# Patient Record
Sex: Female | Born: 1980
Health system: Southern US, Community
[De-identification: ages and names within clinical notes are randomized; demographics above are authoritative.]

## PROBLEM LIST (undated history)

## (undated) ENCOUNTER — Inpatient Hospital Stay (HOSPITAL_COMMUNITY): Payer: Self-pay

## (undated) ENCOUNTER — Inpatient Hospital Stay (HOSPITAL_COMMUNITY): Payer: 59

## (undated) DIAGNOSIS — R87619 Unspecified abnormal cytological findings in specimens from cervix uteri: Secondary | ICD-10-CM

## (undated) DIAGNOSIS — D649 Anemia, unspecified: Secondary | ICD-10-CM

## (undated) DIAGNOSIS — O99345 Other mental disorders complicating the puerperium: Secondary | ICD-10-CM

## (undated) DIAGNOSIS — F53 Postpartum depression: Secondary | ICD-10-CM

## (undated) DIAGNOSIS — IMO0002 Reserved for concepts with insufficient information to code with codable children: Secondary | ICD-10-CM

## (undated) HISTORY — PX: WISDOM TOOTH EXTRACTION: SHX21

---

## 2007-01-21 HISTORY — PX: APPENDECTOMY: SHX54

## 2008-03-16 ENCOUNTER — Other Ambulatory Visit: Admission: RE | Admit: 2008-03-16 | Discharge: 2008-03-16 | Payer: Self-pay | Admitting: Obstetrics and Gynecology

## 2008-09-13 ENCOUNTER — Inpatient Hospital Stay (HOSPITAL_COMMUNITY): Admission: AD | Admit: 2008-09-13 | Discharge: 2008-09-15 | Payer: Self-pay | Admitting: Obstetrics and Gynecology

## 2009-05-07 ENCOUNTER — Other Ambulatory Visit: Admission: RE | Admit: 2009-05-07 | Discharge: 2009-05-07 | Payer: Self-pay | Admitting: Obstetrics and Gynecology

## 2010-04-27 LAB — CBC
Hemoglobin: 11.6 g/dL — ABNORMAL LOW (ref 12.0–15.0)
MCHC: 32.6 g/dL (ref 30.0–36.0)
MCHC: 33.4 g/dL (ref 30.0–36.0)
Platelets: 202 10*3/uL (ref 150–400)
RBC: 3.14 MIL/uL — ABNORMAL LOW (ref 3.87–5.11)
RDW: 13.1 % (ref 11.5–15.5)

## 2010-04-27 LAB — RPR: RPR Ser Ql: NONREACTIVE

## 2010-05-17 ENCOUNTER — Inpatient Hospital Stay (HOSPITAL_COMMUNITY)
Admission: AD | Admit: 2010-05-17 | Discharge: 2010-05-20 | DRG: 775 | Disposition: A | Discharge status: Home / Self Care | Payer: Managed Care, Other (non HMO) | Source: Ambulatory Visit | Attending: Obstetrics and Gynecology | Admitting: Obstetrics and Gynecology

## 2010-05-17 DIAGNOSIS — O99892 Other specified diseases and conditions complicating childbirth: Secondary | ICD-10-CM | POA: Diagnosis present

## 2010-05-17 DIAGNOSIS — Z22330 Carrier of Group B streptococcus: Secondary | ICD-10-CM

## 2010-05-18 LAB — CBC
Hemoglobin: 9.5 g/dL — ABNORMAL LOW (ref 12.0–15.0)
MCV: 86.2 fL (ref 78.0–100.0)
Platelets: 199 10*3/uL (ref 150–400)
RBC: 3.49 MIL/uL — ABNORMAL LOW (ref 3.87–5.11)
WBC: 10.5 10*3/uL (ref 4.0–10.5)

## 2010-05-18 LAB — RPR: RPR Ser Ql: NONREACTIVE

## 2010-05-19 LAB — CBC
Hemoglobin: 9.9 g/dL — ABNORMAL LOW (ref 12.0–15.0)
MCV: 85.6 fL (ref 78.0–100.0)
Platelets: 192 10*3/uL (ref 150–400)
RBC: 3.62 MIL/uL — ABNORMAL LOW (ref 3.87–5.11)
WBC: 12.7 10*3/uL — ABNORMAL HIGH (ref 4.0–10.5)

## 2010-05-29 ENCOUNTER — Inpatient Hospital Stay (HOSPITAL_COMMUNITY)
Admission: AD | Admit: 2010-05-29 | Discharge: 2010-05-30 | Disposition: A | Discharge status: Home / Self Care | Payer: Managed Care, Other (non HMO) | Source: Ambulatory Visit | Attending: Obstetrics and Gynecology | Admitting: Obstetrics and Gynecology

## 2010-05-29 DIAGNOSIS — O9122 Nonpurulent mastitis associated with the puerperium: Secondary | ICD-10-CM

## 2010-05-29 DIAGNOSIS — O864 Pyrexia of unknown origin following delivery: Secondary | ICD-10-CM

## 2010-05-29 LAB — CBC
HCT: 32.4 % — ABNORMAL LOW (ref 36.0–46.0)
MCV: 83.9 fL (ref 78.0–100.0)
RBC: 3.86 MIL/uL — ABNORMAL LOW (ref 3.87–5.11)
WBC: 12.3 10*3/uL — ABNORMAL HIGH (ref 4.0–10.5)

## 2010-05-29 LAB — DIFFERENTIAL
Lymphocytes Relative: 9 % — ABNORMAL LOW (ref 12–46)
Lymphs Abs: 1.1 10*3/uL (ref 0.7–4.0)
Neutrophils Relative %: 85 % — ABNORMAL HIGH (ref 43–77)

## 2010-08-21 ENCOUNTER — Other Ambulatory Visit (HOSPITAL_COMMUNITY)
Admission: RE | Admit: 2010-08-21 | Discharge: 2010-08-21 | Disposition: A | Discharge status: Home / Self Care | Payer: Managed Care, Other (non HMO) | Source: Ambulatory Visit | Attending: Obstetrics and Gynecology | Admitting: Obstetrics and Gynecology

## 2010-08-21 DIAGNOSIS — Z01419 Encounter for gynecological examination (general) (routine) without abnormal findings: Secondary | ICD-10-CM | POA: Insufficient documentation

## 2010-11-11 LAB — OB RESULTS CONSOLE ANTIBODY SCREEN: Antibody Screen: NEGATIVE

## 2010-11-11 LAB — OB RESULTS CONSOLE HIV ANTIBODY (ROUTINE TESTING): HIV: NONREACTIVE

## 2010-11-11 LAB — OB RESULTS CONSOLE RUBELLA ANTIBODY, IGM: Rubella: IMMUNE

## 2011-01-03 ENCOUNTER — Other Ambulatory Visit: Payer: Self-pay | Admitting: Obstetrics and Gynecology

## 2011-01-09 ENCOUNTER — Other Ambulatory Visit (HOSPITAL_COMMUNITY): Payer: Self-pay | Admitting: Obstetrics and Gynecology

## 2011-01-09 DIAGNOSIS — O280 Abnormal hematological finding on antenatal screening of mother: Secondary | ICD-10-CM

## 2011-01-21 NOTE — L&D Delivery Note (Signed)
Delivery Note At 12:52 AM a viable female was delivered via Vaginal, Spontaneous Delivery (Presentation: Middle Occiput Anterior).  APGAR: 9, 9; weight .   Placenta status: Intact, Spontaneous.  Cord: 3 vessels with the following complications: None.  Cord pH: NA  Anesthesia: Local  Episiotomy: None Lacerations: 1st degree;Perineal Suture Repair: 3.0 vicryl Est. Blood Loss (mL): 150  Mom to postpartum.  Baby to nursery-stable.  Molly Rizo J. 06/11/2011, 1:11 AM

## 2011-01-22 ENCOUNTER — Ambulatory Visit (HOSPITAL_COMMUNITY): Payer: Managed Care, Other (non HMO)

## 2011-01-24 ENCOUNTER — Ambulatory Visit (HOSPITAL_COMMUNITY)
Admission: RE | Admit: 2011-01-24 | Discharge: 2011-01-24 | Disposition: A | Discharge status: Home / Self Care | Payer: Managed Care, Other (non HMO) | Source: Ambulatory Visit | Attending: Obstetrics and Gynecology | Admitting: Obstetrics and Gynecology

## 2011-01-24 ENCOUNTER — Encounter (HOSPITAL_COMMUNITY): Payer: Self-pay

## 2011-01-24 DIAGNOSIS — O280 Abnormal hematological finding on antenatal screening of mother: Secondary | ICD-10-CM

## 2011-01-24 DIAGNOSIS — O358XX0 Maternal care for other (suspected) fetal abnormality and damage, not applicable or unspecified: Secondary | ICD-10-CM | POA: Insufficient documentation

## 2011-01-24 DIAGNOSIS — Z1389 Encounter for screening for other disorder: Secondary | ICD-10-CM | POA: Insufficient documentation

## 2011-01-24 DIAGNOSIS — Z363 Encounter for antenatal screening for malformations: Secondary | ICD-10-CM | POA: Insufficient documentation

## 2011-01-24 NOTE — Progress Notes (Signed)
Genetic Counseling  High-Risk Gestation Note  Appointment Date:  01/24/2011 Referred By: Molly Silva., MD Date of Birth:  Jun 07, 1980 Partner:  Molly Silva  Pregnancy History: [id number] Estimated Date of Delivery: 06/17/11 Estimated Gestational Age: [redacted]w[redacted]d Attending: Particia Nearing, MD  [name] and her husband, Mr. Molly Silva, were seen for genetic counseling because of an increased risk for fetal Down syndrome based on Quad screening performed through [facility].  They were counseled regarding the Quad screen result and the associated 1 in 112 risk for fetal Down syndrome.  We reviewed chromosomes, nondisjunction, and the common features and variable prognosis of Down syndrome.  In addition, we reviewed the screen negative risks for trisomy 18 and ONTDs.  We also discussed other explanations for a screen positive result including: a gestational dating error, differences in maternal metabolism, and normal variation.  We reviewed other available screening and diagnostic options including detailed ultrasound and amniocentesis.  We discussed the risks, limitations, and benefits of each. We discussed another type of screening test, noninvasive prenatal testing (NIPT), which utilizes cell free fetal DNA found in the maternal circulation. This test is not diagnostic for chromosome conditions, but can provide information regarding the presence or absence of extra fetal DNA for chromosomes 13, 18 and 21. The reported detection rate is greater than 99% for Trisomy 21, greater than 97% for Trisomy 18, and is approximately 80% (8 out of 10) for Trisomy 13. The false positive rate is thought to be less than 1% for any of these conditions. After thoughtful consideration of these options, [name] elected to have ultrasound, but declined amniocentesis and cell free fetal DNA testing at this time.  They understand that ultrasound cannot rule out all birth defects or genetic syndromes.  The patient  was advised of this limitation and states she still does not want diagnostic testing at this time.  However, they were counseled that 50-80% of fetuses with Down syndrome, when well visualized, have detectable anomalies or soft markers by ultrasound.  A complete ultrasound was performed today.  The ultrasound report will be sent under separate cover.  Molly Silva was provided with written information regarding sickle cell anemia (SCA) including the carrier frequency and incidence in the African-American population, the availability of carrier testing and prenatal diagnosis if indicated.  In addition, we discussed that hemoglobinopathies are routinely screened for as part of the [place] newborn screening panel.  She declined hemoglobin electrophoresis and additional discussion of SCA screening today.   Both family histories were reviewed and found to be noncontributory for birth defects, mental retardation, recurrent pregnancy loss, or known genetic conditions. Without further information regarding the provided family history, an accurate genetic risk cannot be calculated. Further genetic counseling is warranted if more information is obtained.  Molly Silva denied exposure to environmental toxins or chemical agents. She denied the use of alcohol, tobacco or street drugs. She denied significant viral illnesses during the course of her pregnancy. Her medical and surgical histories were noncontributory.   I counseled this couple for approximately 30 minutes regarding the above risks and available options.     Molly Plowman, MS,  Certified Genetic Counselor 01/24/2011

## 2011-01-28 ENCOUNTER — Other Ambulatory Visit: Payer: Self-pay

## 2011-05-20 LAB — OB RESULTS CONSOLE GBS: GBS: POSITIVE

## 2011-06-10 ENCOUNTER — Encounter (HOSPITAL_COMMUNITY): Payer: Self-pay

## 2011-06-10 ENCOUNTER — Inpatient Hospital Stay (HOSPITAL_COMMUNITY)
Admission: AD | Admit: 2011-06-10 | Discharge: 2011-06-13 | DRG: 775 | Disposition: A | Discharge status: Home / Self Care | Payer: Managed Care, Other (non HMO) | Source: Ambulatory Visit | Attending: Obstetrics and Gynecology | Admitting: Obstetrics and Gynecology

## 2011-06-10 DIAGNOSIS — O99892 Other specified diseases and conditions complicating childbirth: Secondary | ICD-10-CM | POA: Diagnosis present

## 2011-06-10 DIAGNOSIS — Z22330 Carrier of Group B streptococcus: Secondary | ICD-10-CM

## 2011-06-10 HISTORY — DX: Unspecified abnormal cytological findings in specimens from cervix uteri: R87.619

## 2011-06-10 HISTORY — DX: Reserved for concepts with insufficient information to code with codable children: IMO0002

## 2011-06-10 LAB — CBC
HCT: 35.2 % — ABNORMAL LOW (ref 36.0–46.0)
MCHC: 32.4 g/dL (ref 30.0–36.0)
MCV: 85 fL (ref 78.0–100.0)
RDW: 13.2 % (ref 11.5–15.5)

## 2011-06-10 MED ORDER — IBUPROFEN 600 MG PO TABS: 600.0000 mg | ORAL_TABLET | Freq: Four times a day (QID) | ORAL | Status: DC | PRN

## 2011-06-10 MED ORDER — LIDOCAINE HCL (PF) 1 % IJ SOLN
30.0000 mL | INTRAMUSCULAR | Status: DC | PRN
Start: 2011-06-10 — End: 2011-06-11
  Administered 2011-06-11: 30 mL via SUBCUTANEOUS
  Filled 2011-06-10: qty 30

## 2011-06-10 MED ORDER — LACTATED RINGERS IV SOLN
INTRAVENOUS | Status: DC
  Administered 2011-06-10: 22:00:00 via INTRAVENOUS

## 2011-06-10 MED ORDER — BUTORPHANOL TARTRATE 2 MG/ML IJ SOLN: 1.0000 mg | INTRAMUSCULAR | Status: DC | PRN

## 2011-06-10 MED ORDER — OXYTOCIN 20 UNITS IN LACTATED RINGERS INFUSION - SIMPLE
125.0000 mL/h | Freq: Once | INTRAVENOUS | Status: AC
  Administered 2011-06-11: 125 mL/h via INTRAVENOUS

## 2011-06-10 MED ORDER — PENICILLIN G POTASSIUM 5000000 UNITS IJ SOLR
5.0000 10*6.[IU] | Freq: Once | INTRAVENOUS | Status: AC
  Administered 2011-06-10: 5 10*6.[IU] via INTRAVENOUS
  Filled 2011-06-10: qty 5

## 2011-06-10 MED ORDER — OXYTOCIN BOLUS FROM INFUSION
500.0000 mL | Freq: Once | INTRAVENOUS | Status: AC
  Administered 2011-06-11: 500 mL via INTRAVENOUS
  Filled 2011-06-10: qty 500
  Filled 2011-06-10: qty 1000

## 2011-06-10 MED ORDER — ACETAMINOPHEN 325 MG PO TABS: 650.0000 mg | ORAL_TABLET | ORAL | Status: DC | PRN

## 2011-06-10 MED ORDER — LACTATED RINGERS IV SOLN: 500.0000 mL | INTRAVENOUS | Status: DC | PRN

## 2011-06-10 MED ORDER — FLEET ENEMA 7-19 GM/118ML RE ENEM: 1.0000 | ENEMA | RECTAL | Status: DC | PRN

## 2011-06-10 MED ORDER — DEXTROSE 5 % IV SOLN
2.5000 10*6.[IU] | INTRAVENOUS | Status: DC
  Filled 2011-06-10 (×4): qty 2.5

## 2011-06-10 MED ORDER — OXYCODONE-ACETAMINOPHEN 5-325 MG PO TABS: 1.0000 | ORAL_TABLET | ORAL | Status: DC | PRN

## 2011-06-10 MED ORDER — ONDANSETRON HCL 4 MG/2ML IJ SOLN: 4.0000 mg | Freq: Four times a day (QID) | INTRAMUSCULAR | Status: DC | PRN

## 2011-06-10 MED ORDER — CITRIC ACID-SODIUM CITRATE 334-500 MG/5ML PO SOLN: 30.0000 mL | ORAL | Status: DC | PRN

## 2011-06-10 NOTE — H&P (Addendum)
Molly Silva is a 31 y.o. female presenting c/o of contractions q3-5 minutes since 6 pm this evening.  questionable LOF at 1930 clear fluid . +FM no VB  OB History    Grav Para Term Preterm Abortions TAB SAB Ect Mult Living   4 2 2  0 1 1 0 0 0 2     Past Medical History  Diagnosis Date  . Abnormal Pap smear    Past Surgical History  Procedure Date  . Appendectomy 2009   Family History: family history is not on file. Social History:  reports that she has never smoked. She has never used smokeless tobacco. She reports that she does not drink alcohol or use illicit drugs.  Medication PNV Allergies NKDA   Dilation: 3 Effacement (%): 80 Blood pressure 112/72, pulse 80, temperature 98.6 F (37 C), temperature source Oral, resp. rate 20, height 5' 6.75" (1.695 m), weight 66.679 kg (147 lb), last menstrual period 09/10/2010. CV RRR LUNG clear Abd gravid nontender  Ext no edema  cx 3/75/0 station no pooling with valsalva  FHR baseline 130's good btbv +accels variable decels Category 1 tracing  Toco ctx q 3-5 minutes  Prenatal labs: ABO, Rh:   o positive  Antibody: Negative (10/22 0000) Rubella:  Immune  RPR: Nonreactive (03/01 0000)  HBsAg: Negative (10/22 0000)  HIV: Non-reactive (10/22 0000)  GBS: Positive (04/30 0000)   Assessment/Plan: 39 wks labor h/o precipitous delivery  GBS positive. PCN for prophylaxis  Anticipate svd    Ziggy Reveles J. 06/10/2011, 10:43 PM

## 2011-06-11 ENCOUNTER — Encounter (HOSPITAL_COMMUNITY): Payer: Self-pay

## 2011-06-11 MED ORDER — ONDANSETRON HCL 4 MG PO TABS: 4.0000 mg | ORAL_TABLET | ORAL | Status: DC | PRN

## 2011-06-11 MED ORDER — IBUPROFEN 600 MG PO TABS
600.0000 mg | ORAL_TABLET | Freq: Four times a day (QID) | ORAL | Status: DC
  Filled 2011-06-11 (×4): qty 1

## 2011-06-11 MED ORDER — LANOLIN HYDROUS EX OINT: TOPICAL_OINTMENT | CUTANEOUS | Status: DC | PRN

## 2011-06-11 MED ORDER — BENZOCAINE-MENTHOL 20-0.5 % EX AERO: 1.0000 "application " | INHALATION_SPRAY | CUTANEOUS | Status: DC | PRN

## 2011-06-11 MED ORDER — SENNOSIDES-DOCUSATE SODIUM 8.6-50 MG PO TABS
2.0000 | ORAL_TABLET | Freq: Every day | ORAL | Status: DC
  Administered 2011-06-11 – 2011-06-12 (×2): 2 via ORAL

## 2011-06-11 MED ORDER — SIMETHICONE 80 MG PO CHEW: 80.0000 mg | CHEWABLE_TABLET | ORAL | Status: DC | PRN

## 2011-06-11 MED ORDER — METHYLERGONOVINE MALEATE 0.2 MG PO TABS: 0.2000 mg | ORAL_TABLET | ORAL | Status: DC | PRN

## 2011-06-11 MED ORDER — ONDANSETRON HCL 4 MG/2ML IJ SOLN: 4.0000 mg | INTRAMUSCULAR | Status: DC | PRN

## 2011-06-11 MED ORDER — TETANUS-DIPHTH-ACELL PERTUSSIS 5-2.5-18.5 LF-MCG/0.5 IM SUSP: 0.5000 mL | Freq: Once | INTRAMUSCULAR | Status: DC

## 2011-06-11 MED ORDER — ZOLPIDEM TARTRATE 5 MG PO TABS: 5.0000 mg | ORAL_TABLET | Freq: Every evening | ORAL | Status: DC | PRN

## 2011-06-11 MED ORDER — WITCH HAZEL-GLYCERIN EX PADS: 1.0000 "application " | MEDICATED_PAD | CUTANEOUS | Status: DC | PRN

## 2011-06-11 MED ORDER — PRENATAL MULTIVITAMIN CH
1.0000 | ORAL_TABLET | Freq: Every day | ORAL | Status: DC
  Administered 2011-06-11 – 2011-06-13 (×3): 1 via ORAL
  Filled 2011-06-11: qty 1

## 2011-06-11 MED ORDER — METHYLERGONOVINE MALEATE 0.2 MG/ML IJ SOLN
0.2000 mg | INTRAMUSCULAR | Status: DC | PRN
Start: 2011-06-11 — End: 2011-06-13

## 2011-06-11 MED ORDER — OXYCODONE-ACETAMINOPHEN 5-325 MG PO TABS: 1.0000 | ORAL_TABLET | ORAL | Status: DC | PRN

## 2011-06-11 MED ORDER — DIBUCAINE 1 % RE OINT: 1.0000 "application " | TOPICAL_OINTMENT | RECTAL | Status: DC | PRN

## 2011-06-11 MED ORDER — PRENATAL MULTIVITAMIN CH
1.0000 | ORAL_TABLET | Freq: Every day | ORAL | Status: DC
  Administered 2011-06-11: 1 via ORAL
  Filled 2011-06-11: qty 1

## 2011-06-11 MED ORDER — DIPHENHYDRAMINE HCL 25 MG PO CAPS: 25.0000 mg | ORAL_CAPSULE | Freq: Four times a day (QID) | ORAL | Status: DC | PRN

## 2011-06-12 LAB — CBC
Platelets: 215 10*3/uL (ref 150–400)
RDW: 13.4 % (ref 11.5–15.5)
WBC: 13.6 10*3/uL — ABNORMAL HIGH (ref 4.0–10.5)

## 2011-06-12 NOTE — Progress Notes (Signed)
Post Partum Day 1 s/p svd  Subjective: no complaints, up ad lib, voiding and tolerating PO  Objective: Blood pressure 104/73, pulse 78, temperature 98 F (36.7 C), temperature source Oral, resp. rate 18, height 5' 6.75" (1.695 m), weight 66.679 kg (147 lb), last menstrual period 09/10/2010, SpO2 98.00%, unknown if currently breastfeeding.  Physical Exam:  General: alert and cooperative Lochia: appropriate Uterine Fundus: firm Incision: NA DVT Evaluation: No evidence of DVT seen on physical exam.   Basename 06/12/11 0530 06/10/11 2210  HGB 10.6* 11.4*  HCT 32.9* 35.2*    Assessment/Plan: Plan for discharge tomorrow and Breastfeeding   LOS: 2 days   Trentyn Boisclair J. 06/12/2011, 2:50 PM

## 2011-06-13 MED ORDER — IBUPROFEN 600 MG PO TABS: 600.0000 mg | ORAL_TABLET | Freq: Four times a day (QID) | ORAL | Status: AC | PRN

## 2011-06-13 NOTE — Discharge Summary (Signed)
Obstetric Discharge Summary Reason for Admission: onset of labor Prenatal Procedures: none Intrapartum Procedures: spontaneous vaginal delivery Postpartum Procedures: none Complications-Operative and Postpartum: 1st degree perineal laceration Hemoglobin  Date Value Range Status  06/12/2011 10.6* 12.0-15.0 (g/dL) Final     HCT  Date Value Range Status  06/12/2011 32.9* 36.0-46.0 (%) Final    Physical Exam:  General: alert and cooperative Lochia: appropriate Uterine Fundus: firm Incision: na DVT Evaluation: No evidence of DVT seen on physical exam.  Discharge Diagnoses: Term Pregnancy-delivered  Discharge Information: Date: 06/13/2011 Activity: pelvic rest Diet: routine Medications: PNV and Ibuprofen Condition: improved Instructions: refer to practice specific booklet Discharge to: home Follow-up Information    Follow up with Jessee Avers., MD. Schedule an appointment as soon as possible for a visit in 6 weeks.   Contact information:   [address] [place] [address] [phone]          Newborn Data: Live born female  Birth Weight: 6 lb 7.7 oz (2940 g) APGAR: 9, 9  Home with mother.  Kelis Plasse J. 06/13/2011, 8:27 AM

## 2011-06-13 NOTE — Progress Notes (Signed)
Post Partum Day 2 Subjective: no complaints, up ad lib, voiding and tolerating PO  Objective: Blood pressure 114/81, pulse 82, temperature 97.9 F (36.6 C), temperature source Oral, resp. rate 18, height 5' 6.75" (1.695 m), weight 66.679 kg (147 lb), last menstrual period 09/10/2010, SpO2 98.00%, unknown if currently breastfeeding.  Physical Exam:  General: alert and cooperative Lochia: appropriate Uterine Fundus: firm Incision: NA DVT Evaluation: No evidence of DVT seen on physical exam.   Basename 06/12/11 0530 06/10/11 2210  HGB 10.6* 11.4*  HCT 32.9* 35.2*    Assessment/Plan: Discharge home and Breastfeeding   LOS: 3 days   Molly Silva J. 06/13/2011, 8:24 AM

## 2011-09-05 ENCOUNTER — Other Ambulatory Visit (HOSPITAL_COMMUNITY)
Admission: RE | Admit: 2011-09-05 | Discharge: 2011-09-05 | Disposition: A | Discharge status: Home / Self Care | Payer: Managed Care, Other (non HMO) | Source: Ambulatory Visit | Attending: Obstetrics and Gynecology | Admitting: Obstetrics and Gynecology

## 2011-09-05 DIAGNOSIS — Z01419 Encounter for gynecological examination (general) (routine) without abnormal findings: Secondary | ICD-10-CM | POA: Insufficient documentation

## 2012-09-01 ENCOUNTER — Other Ambulatory Visit: Payer: Self-pay | Admitting: Obstetrics and Gynecology

## 2012-09-01 ENCOUNTER — Other Ambulatory Visit (HOSPITAL_COMMUNITY)
Admission: RE | Admit: 2012-09-01 | Discharge: 2012-09-01 | Disposition: A | Discharge status: Home / Self Care | Payer: Managed Care, Other (non HMO) | Source: Ambulatory Visit | Attending: Obstetrics and Gynecology | Admitting: Obstetrics and Gynecology

## 2012-09-01 DIAGNOSIS — Z1151 Encounter for screening for human papillomavirus (HPV): Secondary | ICD-10-CM | POA: Insufficient documentation

## 2012-09-01 DIAGNOSIS — Z01419 Encounter for gynecological examination (general) (routine) without abnormal findings: Secondary | ICD-10-CM | POA: Insufficient documentation

## 2012-11-17 LAB — OB RESULTS CONSOLE ANTIBODY SCREEN: ANTIBODY SCREEN: NEGATIVE

## 2012-11-17 LAB — OB RESULTS CONSOLE RPR: RPR: NONREACTIVE

## 2012-11-17 LAB — OB RESULTS CONSOLE HEPATITIS B SURFACE ANTIGEN: HEP B S AG: NEGATIVE

## 2012-11-17 LAB — OB RESULTS CONSOLE ABO/RH: RH TYPE: POSITIVE

## 2012-11-17 LAB — OB RESULTS CONSOLE RUBELLA ANTIBODY, IGM: RUBELLA: IMMUNE

## 2012-11-17 LAB — OB RESULTS CONSOLE HIV ANTIBODY (ROUTINE TESTING): HIV: NONREACTIVE

## 2012-12-15 LAB — OB RESULTS CONSOLE GC/CHLAMYDIA
Chlamydia: NEGATIVE
GC PROBE AMP, GENITAL: NEGATIVE

## 2013-01-20 NOTE — L&D Delivery Note (Signed)
Delivery Note At 11:26 PM a viable female was delivered via Vaginal, Spontaneous Delivery (Presentation: Left Occiput Anterior).  APGAR: 8, 9; weight  pending.   Placenta status: Intact, Spontaneous.  Cord: 3 vessels with the following complications: None.  Cord pH: n/a Baby delivered by Molly Davidsonana Harris, RN.  Dr. Ike Benedom repairing 1st degree laceration upon my arrival.  Anesthesia: None  Episiotomy: None Lacerations: 1st degree;Perineal Suture Repair: 3.0 monocryl Est. Blood Loss (mL): 250  Mom to postpartum.  Baby to Couplet care / Skin to Skin.  Molly RankinsVARNADO, Molly Silva 07/02/2013, 11:49 PM

## 2013-07-02 ENCOUNTER — Encounter (HOSPITAL_COMMUNITY): Payer: Self-pay

## 2013-07-02 ENCOUNTER — Inpatient Hospital Stay (HOSPITAL_COMMUNITY)
Admission: AD | Admit: 2013-07-02 | Discharge: 2013-07-04 | DRG: 775 | Disposition: A | Discharge status: Home / Self Care | Payer: Managed Care, Other (non HMO) | Source: Ambulatory Visit | Attending: Obstetrics and Gynecology | Admitting: Obstetrics and Gynecology

## 2013-07-02 DIAGNOSIS — O99892 Other specified diseases and conditions complicating childbirth: Secondary | ICD-10-CM | POA: Diagnosis present

## 2013-07-02 DIAGNOSIS — O9989 Other specified diseases and conditions complicating pregnancy, childbirth and the puerperium: Secondary | ICD-10-CM

## 2013-07-02 DIAGNOSIS — Z22330 Carrier of Group B streptococcus: Secondary | ICD-10-CM

## 2013-07-02 DIAGNOSIS — Z3490 Encounter for supervision of normal pregnancy, unspecified, unspecified trimester: Secondary | ICD-10-CM

## 2013-07-02 LAB — CBC
HEMATOCRIT: 32.1 % — AB (ref 36.0–46.0)
Hemoglobin: 10.5 g/dL — ABNORMAL LOW (ref 12.0–15.0)
MCH: 27.6 pg (ref 26.0–34.0)
MCHC: 32.7 g/dL (ref 30.0–36.0)
MCV: 84.5 fL (ref 78.0–100.0)
PLATELETS: 187 10*3/uL (ref 150–400)
RBC: 3.8 MIL/uL — ABNORMAL LOW (ref 3.87–5.11)
RDW: 13.8 % (ref 11.5–15.5)
WBC: 12.4 10*3/uL — ABNORMAL HIGH (ref 4.0–10.5)

## 2013-07-02 LAB — OB RESULTS CONSOLE GBS: GBS: POSITIVE

## 2013-07-02 MED ORDER — OXYTOCIN 40 UNITS IN LACTATED RINGERS INFUSION - SIMPLE MED: 62.5000 mL/h | INTRAVENOUS | Status: DC

## 2013-07-02 MED ORDER — LACTATED RINGERS IV SOLN: 500.0000 mL | INTRAVENOUS | Status: DC | PRN

## 2013-07-02 MED ORDER — CITRIC ACID-SODIUM CITRATE 334-500 MG/5ML PO SOLN: 30.0000 mL | ORAL | Status: DC | PRN

## 2013-07-02 MED ORDER — SODIUM CHLORIDE 0.9 % IV SOLN
2.0000 g | Freq: Once | INTRAVENOUS | Status: DC
  Filled 2013-07-02: qty 2000

## 2013-07-02 MED ORDER — ACETAMINOPHEN 325 MG PO TABS: 650.0000 mg | ORAL_TABLET | ORAL | Status: DC | PRN

## 2013-07-02 MED ORDER — LIDOCAINE HCL (PF) 1 % IJ SOLN
30.0000 mL | INTRAMUSCULAR | Status: DC | PRN
  Filled 2013-07-02: qty 30

## 2013-07-02 MED ORDER — FLEET ENEMA 7-19 GM/118ML RE ENEM: 1.0000 | ENEMA | Freq: Every day | RECTAL | Status: DC | PRN

## 2013-07-02 MED ORDER — IBUPROFEN 600 MG PO TABS: 600.0000 mg | ORAL_TABLET | Freq: Four times a day (QID) | ORAL | Status: DC | PRN

## 2013-07-02 MED ORDER — LIDOCAINE HCL (PF) 1 % IJ SOLN
INTRAMUSCULAR | Status: AC
  Administered 2013-07-02: 30 mL
  Filled 2013-07-02: qty 30

## 2013-07-02 MED ORDER — ONDANSETRON HCL 4 MG/2ML IJ SOLN: 4.0000 mg | Freq: Four times a day (QID) | INTRAMUSCULAR | Status: DC | PRN

## 2013-07-02 MED ORDER — OXYTOCIN BOLUS FROM INFUSION
500.0000 mL | INTRAVENOUS | Status: DC
  Administered 2013-07-02: 500 mL via INTRAVENOUS

## 2013-07-02 MED ORDER — OXYTOCIN 40 UNITS IN LACTATED RINGERS INFUSION - SIMPLE MED
INTRAVENOUS | Status: AC
  Filled 2013-07-02: qty 1000

## 2013-07-02 MED ORDER — LACTATED RINGERS IV SOLN: INTRAVENOUS | Status: DC

## 2013-07-02 MED ORDER — OXYCODONE-ACETAMINOPHEN 5-325 MG PO TABS: 1.0000 | ORAL_TABLET | ORAL | Status: DC | PRN

## 2013-07-02 NOTE — H&P (Signed)
Molly Silva is a 33 y.o. female G6 P4024 at 2238 5/7 weeks with h/o precipitous labor presenting with complaint of contractions.  Lost her mucus plug earlier in the day.  Strong contractions started ~ 2 hours prior to arrival. Per RN, Pt was 8 cm on arrival.  Stated she had to push immediately.  Rechecked pt and she was complete.  Baby delivered in 1 push atraumatically by RN.  Pt was GBS positive.  Antibiotics were not given due to precipitous delivery.  Prenatal care with Dr. Richardson Silva has been uncomplicated.  Started at 8 weeks.     Maternal Medical History:  Reason for admission: Contractions.   Contractions: Onset was 1-2 hours ago.   Frequency: regular.   Perceived severity is strong.    Fetal activity: Perceived fetal activity is normal.    Prenatal complications: no prenatal complications Prenatal Complications - Diabetes: none.    OB History   Grav Para Term Preterm Abortions TAB SAB Ect Mult Living   6 3 3  0 2 1 1  0 0 3     Past Medical History  Diagnosis Date  . Abnormal Pap smear   . Medical history non-contributory    Past Surgical History  Procedure Laterality Date  . Appendectomy  2009   Family History: family history is not on file. Social History:  reports that she has never smoked. She has never used smokeless tobacco. She reports that she does not drink alcohol or use illicit drugs.   Prenatal Transfer Tool  Maternal Diabetes: No Genetic Screening: Unsure Maternal Ultrasounds/Referrals: Normal Fetal Ultrasounds or other Referrals:  None Maternal Substance Abuse:  No Significant Maternal Medications:  None Significant Maternal Lab Results:  Lab values include: Group B Strep positive Other Comments:  None  Review of Systems  Gastrointestinal: Positive for abdominal pain.  Genitourinary:       Loss her mucus plug at 1300    Dilation: 10 Effacement (%): 100 Station: +2 Exam by:: B. Cagna, RN Blood pressure 125/85, pulse 121, temperature 98.4 F (36.9  C), height 5\' 6"  (1.676 m), weight 67.132 kg (148 lb), unknown if currently breastfeeding.   Fetal Exam Fetal Monitor Review: Mode: fetoscope.    Fetal State Assessment: Category I - tracings are normal.     Physical Exam  Constitutional: She is oriented to person, place, and time. She appears well-developed and well-nourished. No distress.  HENT:  Head: Normocephalic and atraumatic.  Neck: Normal range of motion.  GI: There is no tenderness.  Uterus firm.  Musculoskeletal: Normal range of motion.  Neurological: She is alert and oriented to person, place, and time.  Skin: Skin is warm and dry.  Psychiatric: She has a normal mood and affect.    Prenatal labs: ABO, Rh: O/Positive/-- (10/29 0000) Antibody: Negative (10/29 0000) Rubella: Immune (10/29 0000) RPR: Nonreactive (10/29 0000)  HBsAg: Negative (10/29 0000)  HIV: Non-reactive (10/29 0000)  GBS: Positive (06/13 0000)   Assessment/Plan: Term pregnancy s/p precipitous delivery. Mother and baby doing well. 1st degree laceration repaired. Routine postpartum care.   Molly Silva, Molly Silva 07/02/2013, 11:42 PM

## 2013-07-02 NOTE — MAU Note (Signed)
Contractions since 1300. Stronger for last 2 hours. GBS  positive

## 2013-07-02 NOTE — Progress Notes (Signed)
[facility] from T[facility] via w/c

## 2013-07-03 ENCOUNTER — Encounter (HOSPITAL_COMMUNITY): Payer: Self-pay | Admitting: *Deleted

## 2013-07-03 LAB — CBC
HEMATOCRIT: 30.2 % — AB (ref 36.0–46.0)
HEMOGLOBIN: 9.6 g/dL — AB (ref 12.0–15.0)
MCH: 27.2 pg (ref 26.0–34.0)
MCHC: 31.8 g/dL (ref 30.0–36.0)
MCV: 85.6 fL (ref 78.0–100.0)
Platelets: 172 10*3/uL (ref 150–400)
RBC: 3.53 MIL/uL — ABNORMAL LOW (ref 3.87–5.11)
RDW: 14 % (ref 11.5–15.5)
WBC: 15.7 10*3/uL — AB (ref 4.0–10.5)

## 2013-07-03 LAB — RPR

## 2013-07-03 MED ORDER — ZOLPIDEM TARTRATE 5 MG PO TABS: 5.0000 mg | ORAL_TABLET | Freq: Every evening | ORAL | Status: DC | PRN

## 2013-07-03 MED ORDER — DIPHENHYDRAMINE HCL 25 MG PO CAPS: 25.0000 mg | ORAL_CAPSULE | Freq: Four times a day (QID) | ORAL | Status: DC | PRN

## 2013-07-03 MED ORDER — ONDANSETRON HCL 4 MG/2ML IJ SOLN: 4.0000 mg | INTRAMUSCULAR | Status: DC | PRN

## 2013-07-03 MED ORDER — PRENATAL MULTIVITAMIN CH
1.0000 | ORAL_TABLET | Freq: Every day | ORAL | Status: DC
  Administered 2013-07-03 – 2013-07-04 (×2): 1 via ORAL
  Filled 2013-07-03 (×2): qty 1

## 2013-07-03 MED ORDER — IBUPROFEN 600 MG PO TABS
600.0000 mg | ORAL_TABLET | Freq: Four times a day (QID) | ORAL | Status: DC
  Filled 2013-07-03 (×2): qty 1

## 2013-07-03 MED ORDER — TETANUS-DIPHTH-ACELL PERTUSSIS 5-2.5-18.5 LF-MCG/0.5 IM SUSP: 0.5000 mL | Freq: Once | INTRAMUSCULAR | Status: DC

## 2013-07-03 MED ORDER — OXYCODONE-ACETAMINOPHEN 5-325 MG PO TABS: 1.0000 | ORAL_TABLET | ORAL | Status: DC | PRN

## 2013-07-03 MED ORDER — FERROUS SULFATE 325 (65 FE) MG PO TABS
325.0000 mg | ORAL_TABLET | Freq: Two times a day (BID) | ORAL | Status: DC
  Administered 2013-07-03 – 2013-07-04 (×3): 325 mg via ORAL
  Filled 2013-07-03 (×3): qty 1

## 2013-07-03 MED ORDER — METHYLERGONOVINE MALEATE 0.2 MG/ML IJ SOLN: 0.2000 mg | INTRAMUSCULAR | Status: DC | PRN

## 2013-07-03 MED ORDER — BENZOCAINE-MENTHOL 20-0.5 % EX AERO
1.0000 "application " | INHALATION_SPRAY | CUTANEOUS | Status: DC | PRN
  Administered 2013-07-03: 1 via TOPICAL
  Filled 2013-07-03: qty 56

## 2013-07-03 MED ORDER — ONDANSETRON HCL 4 MG PO TABS: 4.0000 mg | ORAL_TABLET | ORAL | Status: DC | PRN

## 2013-07-03 MED ORDER — SIMETHICONE 80 MG PO CHEW: 80.0000 mg | CHEWABLE_TABLET | ORAL | Status: DC | PRN

## 2013-07-03 MED ORDER — METHYLERGONOVINE MALEATE 0.2 MG PO TABS: 0.2000 mg | ORAL_TABLET | ORAL | Status: DC | PRN

## 2013-07-03 MED ORDER — DIBUCAINE 1 % RE OINT: 1.0000 "application " | TOPICAL_OINTMENT | RECTAL | Status: DC | PRN

## 2013-07-03 MED ORDER — MAGNESIUM HYDROXIDE 400 MG/5ML PO SUSP: 30.0000 mL | ORAL | Status: DC | PRN

## 2013-07-03 MED ORDER — SENNOSIDES-DOCUSATE SODIUM 8.6-50 MG PO TABS
2.0000 | ORAL_TABLET | ORAL | Status: DC
  Filled 2013-07-03: qty 2

## 2013-07-03 MED ORDER — MEASLES, MUMPS & RUBELLA VAC ~~LOC~~ INJ
0.5000 mL | INJECTION | Freq: Once | SUBCUTANEOUS | Status: DC
  Filled 2013-07-03: qty 0.5

## 2013-07-03 MED ORDER — OXYTOCIN 40 UNITS IN LACTATED RINGERS INFUSION - SIMPLE MED: 62.5000 mL/h | INTRAVENOUS | Status: DC | PRN

## 2013-07-03 MED ORDER — ERYTHROMYCIN 5 MG/GM OP OINT: TOPICAL_OINTMENT | Freq: Once | OPHTHALMIC | Status: DC

## 2013-07-03 MED ORDER — LANOLIN HYDROUS EX OINT: TOPICAL_OINTMENT | CUTANEOUS | Status: DC | PRN

## 2013-07-03 MED ORDER — WITCH HAZEL-GLYCERIN EX PADS: 1.0000 "application " | MEDICATED_PAD | CUTANEOUS | Status: DC | PRN

## 2013-07-03 NOTE — Progress Notes (Signed)
Postpartum day #1, NSVD  Subjective Pt without complaints.  Lochia normal.  Pain controlled.  Breast feeding yes  Temp:  [98.2 F (36.8 C)-98.4 F (36.9 C)] 98.3 F (36.8 C) (06/14 0630) Pulse Rate:  [93-171] 93 (06/14 0630) Resp:  [16-20] 20 (06/14 0630) BP: (104-217)/(53-173) 108/59 mmHg (06/14 0630) Weight:  [67.132 kg (148 lb)] 67.132 kg (148 lb) (06/13 2252)  Gen:  NAD, A&O x 3 Uterine fundus:  Firm, nontender Lochia normal Ext:  Minimal Edema, no calf tenderness bilaterally  CBC    Component Value Date/Time   WBC 15.7* 07/03/2013 0547   RBC 3.53* 07/03/2013 0547   HGB 9.6* 07/03/2013 0547   HCT 30.2* 07/03/2013 0547   PLT 172 07/03/2013 0547   MCV 85.6 07/03/2013 0547   MCH 27.2 07/03/2013 0547   MCHC 31.8 07/03/2013 0547   RDW 14.0 07/03/2013 0547   LYMPHSABS 1.1 05/29/2010 2120   MONOABS 0.7 05/29/2010 2120   EOSABS 0.0 05/29/2010 2120   BASOSABS 0.0 05/29/2010 2120     A/P: S/p SVD doing well. Routine postpartum care. Lactation support. Discharge 48 hours after delivery due to lack of GBS prophylaxis due to precipitous delivery.  Molly Silva, Molly Silva 07/03/2013, 12:08 PM

## 2013-07-04 NOTE — Discharge Summary (Signed)
Obstetric Discharge Summary Reason for Admission: onset of labor Prenatal Procedures: none Intrapartum Procedures: spontaneous vaginal delivery Postpartum Procedures: none Complications-Operative and Postpartum: 1 st  degree perineal laceration Hemoglobin  Date Value Ref Range Status  07/03/2013 9.6* 12.0 - 15.0 g/dL Final     HCT  Date Value Ref Range Status  07/03/2013 30.2* 36.0 - 46.0 % Final    Physical Exam:  General: alert and cooperative Lochia: appropriate Uterine Fundus: firm Incision: NA DVT Evaluation: No evidence of DVT seen on physical exam.  Discharge Diagnoses: Term Pregnancy-delivered  Discharge Information: Date: 07/04/2013 Activity: pelvic rest Diet: routine Medications: PNV and Ibuprofen Condition: stable Instructions: refer to practice specific booklet Discharge to: home Follow-up Information   Follow up with Jessee AversOLE,Quanesha Klimaszewski J., MD. Schedule an appointment as soon as possible for a visit in 6 weeks.   Specialty:  Obstetrics and Gynecology   Contact information:   [address] [place] [address] [phone]       Newborn Data: Live born female  Birth Weight: 6 lb 15.5 oz (3161 g) APGAR: 8, 9  Home with mother.  Sianni Cloninger J. 07/04/2013, 1:00 PM

## 2013-10-11 ENCOUNTER — Other Ambulatory Visit (HOSPITAL_COMMUNITY)
Admission: RE | Admit: 2013-10-11 | Discharge: 2013-10-11 | Disposition: A | Discharge status: Home / Self Care | Payer: Managed Care, Other (non HMO) | Source: Ambulatory Visit | Attending: Obstetrics and Gynecology | Admitting: Obstetrics and Gynecology

## 2013-10-11 ENCOUNTER — Other Ambulatory Visit: Payer: Self-pay | Admitting: Obstetrics and Gynecology

## 2013-10-11 DIAGNOSIS — Z01419 Encounter for gynecological examination (general) (routine) without abnormal findings: Secondary | ICD-10-CM | POA: Insufficient documentation

## 2013-10-12 LAB — CYTOLOGY - PAP

## 2013-11-21 ENCOUNTER — Encounter (HOSPITAL_COMMUNITY): Payer: Self-pay | Admitting: *Deleted

## 2013-12-28 ENCOUNTER — Other Ambulatory Visit: Payer: Self-pay | Admitting: Internal Medicine

## 2013-12-28 DIAGNOSIS — E079 Disorder of thyroid, unspecified: Secondary | ICD-10-CM

## 2014-01-02 ENCOUNTER — Ambulatory Visit
Admission: RE | Admit: 2014-01-02 | Discharge: 2014-01-02 | Disposition: A | Discharge status: Home / Self Care | Payer: Managed Care, Other (non HMO) | Source: Ambulatory Visit | Attending: Internal Medicine | Admitting: Internal Medicine

## 2014-01-02 DIAGNOSIS — E079 Disorder of thyroid, unspecified: Secondary | ICD-10-CM

## 2014-01-20 NOTE — L&D Delivery Note (Signed)
Delivery Note   Pt developed urge to push.  Cervix was 8 cm bulging bag.  Agreeable with AROM.  Clear fluid noted.  Next contraction, pt delivered baby.  At 7:32 AM a viable female was delivered via Vaginal, Spontaneous Delivery (Presentation: Left Occiput Anterior).  APGAR: 9, 9; weight pending .   Placenta status: Intact, Spontaneous.  Cord: 3 vessels with the following complications: None.  Cord pH: n/a  Placenta with marginal insertion of the cord.  Infant with nuchal cord.  Mother could not stop pushing to allow manual reduction.  Baby pushed through without difficulty.  Reduced after body delivered. Anesthesia: None  Episiotomy: None Lacerations: 2nd degree, small Suture Repair: 3.0 chromic Est. Blood Loss (mL):  Less than 200  Mom to postpartum.  Baby to Couplet care / Skin to Skin. Attempted nursing during repair.  Molly Silva 12/24/2014, 8:14 AM

## 2014-05-12 LAB — OB RESULTS CONSOLE RUBELLA ANTIBODY, IGM: RUBELLA: IMMUNE

## 2014-05-12 LAB — OB RESULTS CONSOLE ABO/RH: RH TYPE: POSITIVE

## 2014-05-12 LAB — OB RESULTS CONSOLE RPR: RPR: NONREACTIVE

## 2014-05-12 LAB — OB RESULTS CONSOLE HEPATITIS B SURFACE ANTIGEN: Hepatitis B Surface Ag: NEGATIVE

## 2014-05-12 LAB — OB RESULTS CONSOLE ANTIBODY SCREEN: Antibody Screen: NEGATIVE

## 2014-05-12 LAB — OB RESULTS CONSOLE HIV ANTIBODY (ROUTINE TESTING): HIV: NONREACTIVE

## 2014-06-06 LAB — OB RESULTS CONSOLE GC/CHLAMYDIA
Chlamydia: NEGATIVE
Gonorrhea: NEGATIVE

## 2014-12-01 LAB — OB RESULTS CONSOLE GBS: STREP GROUP B AG: NEGATIVE

## 2014-12-05 ENCOUNTER — Encounter (HOSPITAL_COMMUNITY): Payer: Self-pay | Admitting: *Deleted

## 2014-12-05 ENCOUNTER — Telehealth: Payer: Self-pay

## 2014-12-05 ENCOUNTER — Inpatient Hospital Stay (HOSPITAL_COMMUNITY)
Admission: AD | Admit: 2014-12-05 | Discharge: 2014-12-05 | Disposition: A | Discharge status: Home / Self Care | Payer: BLUE CROSS/BLUE SHIELD | Source: Ambulatory Visit | Attending: Obstetrics and Gynecology | Admitting: Obstetrics and Gynecology

## 2014-12-05 DIAGNOSIS — Z3493 Encounter for supervision of normal pregnancy, unspecified, third trimester: Secondary | ICD-10-CM | POA: Diagnosis not present

## 2014-12-05 NOTE — MAU Note (Signed)
Contractions started at 208 this morning; patients states about 2-4 minutes apart. Denies LOF or VB. +FM. GBS-. States was 1 cm at last office visit.

## 2014-12-05 NOTE — Telephone Encounter (Signed)
Patient call reports contractions are 2-3 minutes apart for the last 45 minutes.  Patient reports she was 1 cm on her last exam on Friday.  Patient with history of precipitous delivery and instructed report to hospital. Patient states that she would like to remain at home longer and monitor, rather than report to hospital immediately.  Patient encouraged to attempt to call back prior to arrival.  No other questions or concerns. JE, CNM

## 2014-12-24 ENCOUNTER — Encounter (HOSPITAL_COMMUNITY): Payer: Self-pay

## 2014-12-24 ENCOUNTER — Inpatient Hospital Stay (HOSPITAL_COMMUNITY)
Admission: AD | Admit: 2014-12-24 | Discharge: 2014-12-26 | DRG: 775 | Disposition: A | Discharge status: Home / Self Care | Payer: BLUE CROSS/BLUE SHIELD | Source: Ambulatory Visit | Attending: Obstetrics and Gynecology | Admitting: Obstetrics and Gynecology

## 2014-12-24 DIAGNOSIS — Z833 Family history of diabetes mellitus: Secondary | ICD-10-CM | POA: Diagnosis not present

## 2014-12-24 DIAGNOSIS — Z3A39 39 weeks gestation of pregnancy: Secondary | ICD-10-CM | POA: Diagnosis not present

## 2014-12-24 DIAGNOSIS — Z8249 Family history of ischemic heart disease and other diseases of the circulatory system: Secondary | ICD-10-CM | POA: Diagnosis not present

## 2014-12-24 DIAGNOSIS — Z3493 Encounter for supervision of normal pregnancy, unspecified, third trimester: Secondary | ICD-10-CM

## 2014-12-24 DIAGNOSIS — IMO0001 Reserved for inherently not codable concepts without codable children: Secondary | ICD-10-CM

## 2014-12-24 DIAGNOSIS — F53 Puerperal psychosis: Secondary | ICD-10-CM | POA: Diagnosis present

## 2014-12-24 HISTORY — DX: Other mental disorders complicating the puerperium: O99.345

## 2014-12-24 HISTORY — DX: Anemia, unspecified: D64.9

## 2014-12-24 HISTORY — DX: Postpartum depression: F53.0

## 2014-12-24 LAB — CBC
HEMATOCRIT: 32.2 % — AB (ref 36.0–46.0)
Hemoglobin: 10.3 g/dL — ABNORMAL LOW (ref 12.0–15.0)
MCH: 27.7 pg (ref 26.0–34.0)
MCHC: 32 g/dL (ref 30.0–36.0)
MCV: 86.6 fL (ref 78.0–100.0)
PLATELETS: 188 10*3/uL (ref 150–400)
RBC: 3.72 MIL/uL — ABNORMAL LOW (ref 3.87–5.11)
RDW: 13.8 % (ref 11.5–15.5)
WBC: 12 10*3/uL — ABNORMAL HIGH (ref 4.0–10.5)

## 2014-12-24 LAB — TYPE AND SCREEN
ABO/RH(D): O POS
Antibody Screen: NEGATIVE

## 2014-12-24 LAB — RPR: RPR: NONREACTIVE

## 2014-12-24 MED ORDER — ONDANSETRON HCL 4 MG PO TABS: 4.0000 mg | ORAL_TABLET | ORAL | Status: DC | PRN

## 2014-12-24 MED ORDER — ZOLPIDEM TARTRATE 5 MG PO TABS: 5.0000 mg | ORAL_TABLET | Freq: Every evening | ORAL | Status: DC | PRN

## 2014-12-24 MED ORDER — OXYTOCIN 40 UNITS IN LACTATED RINGERS INFUSION - SIMPLE MED
62.5000 mL/h | INTRAVENOUS | Status: DC
  Filled 2014-12-24: qty 1000

## 2014-12-24 MED ORDER — MAGNESIUM HYDROXIDE 400 MG/5ML PO SUSP
30.0000 mL | ORAL | Status: DC | PRN
Start: 2014-12-24 — End: 2014-12-26

## 2014-12-24 MED ORDER — OXYTOCIN BOLUS FROM INFUSION
500.0000 mL | INTRAVENOUS | Status: DC
  Administered 2014-12-24: 500 mL via INTRAVENOUS

## 2014-12-24 MED ORDER — OXYCODONE-ACETAMINOPHEN 5-325 MG PO TABS: 1.0000 | ORAL_TABLET | ORAL | Status: DC | PRN

## 2014-12-24 MED ORDER — ACETAMINOPHEN 325 MG PO TABS: 650.0000 mg | ORAL_TABLET | ORAL | Status: DC | PRN

## 2014-12-24 MED ORDER — DIBUCAINE 1 % RE OINT: 1.0000 "application " | TOPICAL_OINTMENT | RECTAL | Status: DC | PRN

## 2014-12-24 MED ORDER — LACTATED RINGERS IV SOLN: 500.0000 mL | INTRAVENOUS | Status: DC | PRN

## 2014-12-24 MED ORDER — LIDOCAINE HCL (PF) 1 % IJ SOLN
30.0000 mL | INTRAMUSCULAR | Status: AC | PRN
  Administered 2014-12-24: 30 mL via SUBCUTANEOUS
  Filled 2014-12-24: qty 30

## 2014-12-24 MED ORDER — BENZOCAINE-MENTHOL 20-0.5 % EX AERO
1.0000 "application " | INHALATION_SPRAY | CUTANEOUS | Status: DC | PRN
  Filled 2014-12-24: qty 56

## 2014-12-24 MED ORDER — LACTATED RINGERS IV SOLN
INTRAVENOUS | Status: DC
  Administered 2014-12-24: 06:00:00 via INTRAVENOUS

## 2014-12-24 MED ORDER — ONDANSETRON HCL 4 MG/2ML IJ SOLN: 4.0000 mg | INTRAMUSCULAR | Status: DC | PRN

## 2014-12-24 MED ORDER — PRENATAL MULTIVITAMIN CH
1.0000 | ORAL_TABLET | Freq: Every day | ORAL | Status: DC
  Administered 2014-12-25: 1 via ORAL
  Filled 2014-12-24: qty 1

## 2014-12-24 MED ORDER — METHYLERGONOVINE MALEATE 0.2 MG/ML IJ SOLN: 0.2000 mg | INTRAMUSCULAR | Status: DC | PRN

## 2014-12-24 MED ORDER — TETANUS-DIPHTH-ACELL PERTUSSIS 5-2.5-18.5 LF-MCG/0.5 IM SUSP: 0.5000 mL | Freq: Once | INTRAMUSCULAR | Status: DC

## 2014-12-24 MED ORDER — IBUPROFEN 600 MG PO TABS
600.0000 mg | ORAL_TABLET | Freq: Four times a day (QID) | ORAL | Status: DC
  Filled 2014-12-24 (×2): qty 1

## 2014-12-24 MED ORDER — ONDANSETRON HCL 4 MG/2ML IJ SOLN: 4.0000 mg | Freq: Four times a day (QID) | INTRAMUSCULAR | Status: DC | PRN

## 2014-12-24 MED ORDER — CITRIC ACID-SODIUM CITRATE 334-500 MG/5ML PO SOLN: 30.0000 mL | ORAL | Status: DC | PRN

## 2014-12-24 MED ORDER — OXYCODONE-ACETAMINOPHEN 5-325 MG PO TABS: 2.0000 | ORAL_TABLET | ORAL | Status: DC | PRN

## 2014-12-24 MED ORDER — SIMETHICONE 80 MG PO CHEW: 80.0000 mg | CHEWABLE_TABLET | ORAL | Status: DC | PRN

## 2014-12-24 MED ORDER — SENNOSIDES-DOCUSATE SODIUM 8.6-50 MG PO TABS
2.0000 | ORAL_TABLET | ORAL | Status: DC
  Filled 2014-12-24: qty 2

## 2014-12-24 MED ORDER — METHYLERGONOVINE MALEATE 0.2 MG PO TABS: 0.2000 mg | ORAL_TABLET | ORAL | Status: DC | PRN

## 2014-12-24 MED ORDER — WITCH HAZEL-GLYCERIN EX PADS: 1.0000 "application " | MEDICATED_PAD | CUTANEOUS | Status: DC | PRN

## 2014-12-24 MED ORDER — OXYTOCIN 40 UNITS IN LACTATED RINGERS INFUSION - SIMPLE MED: 62.5000 mL/h | INTRAVENOUS | Status: DC | PRN

## 2014-12-24 MED ORDER — OXYCODONE-ACETAMINOPHEN 5-325 MG PO TABS
2.0000 | ORAL_TABLET | ORAL | Status: DC | PRN
Start: 2014-12-24 — End: 2014-12-26

## 2014-12-24 MED ORDER — LANOLIN HYDROUS EX OINT: TOPICAL_OINTMENT | CUTANEOUS | Status: DC | PRN

## 2014-12-24 MED ORDER — DIPHENHYDRAMINE HCL 25 MG PO CAPS: 25.0000 mg | ORAL_CAPSULE | Freq: Four times a day (QID) | ORAL | Status: DC | PRN

## 2014-12-24 MED ORDER — FENTANYL CITRATE (PF) 100 MCG/2ML IJ SOLN: 50.0000 ug | INTRAMUSCULAR | Status: DC | PRN

## 2014-12-24 NOTE — MAU Note (Signed)
Contractions every 4 min.  ? Leaking at 0130, clear fluid, baby moving well.  Blood tinge mucus

## 2014-12-24 NOTE — Lactation Note (Addendum)
This note was copied from the chart of Molly Silva. Lactation Consultation Note  P5, Ex BF.  BF other children 1 year - 17 months. Baby has not latched and has been sleepy. Provided mother w/ spoon to hand express and interest her in latching. Suggest mother place baby STS.   Reviewed basics and cluster feeding. Mom encouraged to feed baby 8-12 times/24 hours and with feeding cues.  Mom made aware of O/P services, breastfeeding support groups, community resources, and our phone # for post-discharge questions.  Recommend mother call LC  for assistance with next feeding.  Patient Name: Molly Virl Cageymina Hibbitts UVOZD'GToday's Date: 12/24/2014     Maternal Data    Feeding    LATCH Score/Interventions                      Lactation Tools Discussed/Used     Consult Status      Dahlia ByesBerkelhammer, Ruth Boschen 12/24/2014, 11:40 AM

## 2014-12-24 NOTE — H&P (Addendum)
Molly Silva is a 34 y.o. female G7 P4024 @ 39 3/7 weeks presenting for c/o contractions. Pt noted blood tinged mucus discharge  4 hours ago.  Contractions gradually started shortly thereafter.  Pt denies a gush of fluid or continuous leakage of fluid.  Contractions are not close together but have increased in intensity.   Prenatal care uncomplicated.  Maternal Medical History:  Reason for admission: Contractions.   Contractions: Onset was 3-5 hours ago.   Frequency: regular.   Perceived severity is strong.    Fetal activity: Perceived fetal activity is normal.    Prenatal complications: no prenatal complications Prenatal Complications - Diabetes: none.    OB History    Gravida Para Term Preterm AB TAB SAB Ectopic Multiple Living   7 4 4  0 2 1 1  0 0 4     Past Medical History  Diagnosis Date  . Abnormal Pap smear   . Anemia   . Post partum depression    Past Surgical History  Procedure Laterality Date  . Appendectomy  2009   Family History: family history includes Diabetes in her father; Hypertension in her mother. Social History:  reports that she has never smoked. She has never used smokeless tobacco. She reports that she does not drink alcohol or use illicit drugs.   Prenatal Transfer Tool  Maternal Diabetes: No Genetic Screening: Normal Maternal Ultrasounds/Referrals: Normal Fetal Ultrasounds or other Referrals:  None Maternal Substance Abuse:  No Significant Maternal Medications:  None Significant Maternal Lab Results:  Lab values include: Group B Strep negative Other Comments:  None  Review of Systems  Gastrointestinal: Positive for abdominal pain.  Genitourinary:       C/o blood tinged mucus    Dilation: 3.5 Effacement (%): 70 Exam by:: Dr Dion BodyVarnado unknown if currently breastfeeding. Maternal Exam:  Uterine Assessment: Contraction strength is firm.  Contraction frequency is irregular.   Abdomen: Estimated fetal weight is 6 pounds.   Fetal  presentation: vertex  Introitus: Normal vulva. Ferning test: negative.   Pelvis: adequate for delivery.   Cervix: Cervix evaluated by digital exam.     Fetal Exam Fetal Monitor Review: Mode: fetoscope.   Variability: moderate (6-25 bpm).   Pattern: variable decelerations and accelerations present.    Fetal State Assessment: Category II - tracings are indeterminate.    Good variability, Variable decel with contraction x 1.  Only on monitor ~ 20 minutes before transfer.   Physical Exam  Constitutional: She is oriented to person, place, and time. She appears well-developed and well-nourished.  HENT:  Head: Normocephalic and atraumatic.  Eyes: EOM are normal.  Neck: Normal range of motion.  Respiratory: Effort normal. No respiratory distress.  Musculoskeletal: Normal range of motion. She exhibits no edema or tenderness.  Neurological: She is alert and oriented to person, place, and time.  Skin: Skin is warm and dry.  Psychiatric: She has a normal mood and affect.    Prenatal labs: ABO, Rh: O/Positive/-- (04/22 0000) Antibody: Negative (04/22 0000) Rubella: Immune (04/22 0000) RPR: Nonreactive (04/22 0000)  HBsAg: Negative (04/22 0000)  HIV: Non-reactive (04/22 0000)  GBS: Negative (11/11 0000)   Assessment/Plan: IUP @ 39 3/7 weeks, h/o precipitous labor. Labor.  Intact membranes. Category II tracing, overall reassuring, but pt briefly monitored before transfer.  Observe closely. Desires unmedicated delivery. Expectant management.    Geryl RankinsVARNADO, Barnie Sopko 12/24/2014, 5:29 AM

## 2014-12-25 LAB — CBC
HEMATOCRIT: 31 % — AB (ref 36.0–46.0)
HEMOGLOBIN: 10 g/dL — AB (ref 12.0–15.0)
MCH: 27.4 pg (ref 26.0–34.0)
MCHC: 32.3 g/dL (ref 30.0–36.0)
MCV: 84.9 fL (ref 78.0–100.0)
Platelets: 173 10*3/uL (ref 150–400)
RBC: 3.65 MIL/uL — ABNORMAL LOW (ref 3.87–5.11)
RDW: 13.7 % (ref 11.5–15.5)
WBC: 15.4 10*3/uL — AB (ref 4.0–10.5)

## 2014-12-25 MED ORDER — SERTRALINE HCL 50 MG PO TABS
50.0000 mg | ORAL_TABLET | Freq: Every day | ORAL | Status: DC
  Filled 2014-12-25: qty 1

## 2014-12-25 NOTE — Lactation Note (Signed)
This note was copied from the chart of Molly Silva. Lactation Consultation Note  Patient Name: Molly Virl Cageymina Cumpian WUJWJ'XToday's Date: 12/25/2014 Reason for consult: Follow-up assessment Mom reports baby is nursing well, reports some tenderness on left nipple. Advised to apply EBM and Mom has comfort gels. Mom requested manual pump for home. Mom's nipples are noted to be flat so advised to pre-pump to help with latch. Right nipple becomes erect with pre-pumping. Did not try left nipple.  Mom is experienced BF and denies need for assist. Encouraged to call for help if desired.   Maternal Data    Feeding Feeding Type: Breast Fed  LATCH Score/Interventions Latch: Grasps breast easily, tongue down, lips flanged, rhythmical sucking.  Audible Swallowing: Spontaneous and intermittent  Type of Nipple: Everted at rest and after stimulation  Comfort (Breast/Nipple): Soft / non-tender     Hold (Positioning): No assistance needed to correctly position infant at breast.  LATCH Score: 10  Lactation Tools Discussed/Used Tools: Comfort gels;Pump Breast pump type: Manual   Consult Status Consult Status: PRN Date: 12/26/14 Follow-up type: In-patient    Molly Silva, Molly Silva 12/25/2014, 2:44 PM

## 2014-12-25 NOTE — Progress Notes (Signed)
Post Partum Day 1  Subjective: no complaints, up ad lib, voiding and tolerating PO  Objective: Blood pressure 114/72, pulse 85, temperature 98.3 F (36.8 C), temperature source Oral, resp. rate 18, height 5\' 6"  (1.676 m), weight 69.854 kg (154 lb), SpO2 99 %, unknown if currently breastfeeding.  Physical Exam:  General: alert and cooperative Lochia: appropriate Uterine Fundus: firm Incision: NA DVT Evaluation: No evidence of DVT seen on physical exam.   Recent Labs  12/24/14 0545 12/25/14 0533  HGB 10.3* 10.0*  HCT 32.2* 31.0*    Assessment/Plan: Plan for discharge tomorrow and Breastfeeding  Patient with significant postpartum depression after last delivery. She was treated with zoloft.  Will start zoloft today at 50 mg     LOS: 1 day   Molly Silva J. 12/25/2014, 6:50 PM

## 2014-12-26 MED ORDER — IBUPROFEN 600 MG PO TABS: 600.0000 mg | ORAL_TABLET | Freq: Four times a day (QID) | ORAL | Status: DC | PRN

## 2014-12-26 MED ORDER — SERTRALINE HCL 50 MG PO TABS: 50.0000 mg | ORAL_TABLET | Freq: Every day | ORAL | Status: DC

## 2014-12-26 NOTE — Lactation Note (Signed)
This note was copied from the chart of Molly Silva. Lactation Consultation Note  Patient Name: Molly Virl Cageymina Hilburn WUJWJ'XToday's Date: 12/26/2014 Reason for consult: Follow-up assessment;Breast/nipple pain;Other (Comment) (mom using the comfort gels and they are helping , baby at 4% weight loss, Bili check at 41 hours old - 6.4 )  Mom also has a manual pump for D/C. Latch score 10 , since LC visit yesterday baby has been to the breast x8 5-35 mins. Per mom breast are getting fuller. 6 wets, 10 stools.  LC reviewed sore nipple and engorgement prevention and tx. Mom already has comfort gels, which per mom are helping.  Per mom has hx with 2nd baby of mastitis. LC reviewed prevention and recommended skin to skin feedings until the baby can stay awake for feeding, alternate between at least 2 positions.  Breast massage with feeding.  Mother informed of post-discharge support and given phone number to the lactation department, including services for phone call assistance; out-patient appointments; and breastfeeding support group. List of other breastfeeding resources in the community given in the handout. Encouraged mother to call for problems or concerns related to breastfeeding.   Maternal Data    Feeding Feeding Type:  (per mom baby has been cluster feeding ) Length of feed: 5 min  LATCH Score/Interventions                      Lactation Tools Discussed/Used Tools: Pump;Comfort gels Breast pump type: Manual   Consult Status Consult Status: Complete Date: 12/26/14    Kathrin Greathouseorio, Deseri Loss Ann 12/26/2014, 10:16 AM

## 2014-12-26 NOTE — Discharge Summary (Signed)
OB Discharge Summary     Patient Name: Molly Silva DOB: 07/31/1980 MRN: [id number]  Date of admission: 12/24/2014 Delivering MD: Geryl RankinsVARNADO, EVELYN   Date of discharge: 12/26/2014  Admitting diagnosis: 39wks, contractions Intrauterine pregnancy: 4262w3d     Secondary diagnosis:  Principal Problem:   Active labor at term Active Problems:   SVD (spontaneous vaginal delivery)  Additional problems: History of postpartum depression      Discharge diagnosis: Term Pregnancy Delivered                                                                                                Post partum procedures:None  Augmentation: None  Complications: None  Hospital course:  Onset of Labor With Vaginal Delivery     34 y.o. yo [id number] at 5662w3d was admitted in Active Laboron 12/24/2014. Patient had an uncomplicated labor course as follows:  Membrane Rupture Time/Date: 7:29 AM ,12/24/2014   Intrapartum Procedures: Episiotomy: None [1]                                         Lacerations:  2nd degree [3]  Patient had a delivery of a Viable infant. 12/24/2014  Information for the patient's newborn:  Elyn PeersDouge, Girl Brandelyn [[id number]][[id number]]  Delivery Method: Vaginal, Spontaneous Delivery (Filed from Delivery Summary)    Pateint had an uncomplicated postpartum course.  She is ambulating, tolerating a regular diet, passing flatus, and urinating well. Patient is discharged home in stable condition on No discharge date for patient encounter.Marland Kitchen.    Physical exam  Filed Vitals:   12/24/14 1500 12/25/14 0604 12/25/14 1847 12/26/14 0654  BP: 104/61 109/69 114/72 98/52  Pulse: 84 86 85 74  Temp: 98.3 F (36.8 C) 98.4 F (36.9 C) 98.3 F (36.8 C) 98.7 F (37.1 C)  TempSrc: Oral Oral Oral Oral  Resp: 18 18 18 18   Height:      Weight:      SpO2: 100% 99%     General: alert, cooperative and no distress Lochia: appropriate Uterine Fundus: firm Incision: N/A DVT Evaluation: No evidence of DVT seen on physical  exam. Labs: Lab Results  Component Value Date   WBC 15.4* 12/25/2014   HGB 10.0* 12/25/2014   HCT 31.0* 12/25/2014   MCV 84.9 12/25/2014   PLT 173 12/25/2014   No flowsheet data found.  Discharge instruction: per After Visit Summary and "Baby and Me Booklet".  After visit meds:    Medication List    TAKE these medications        ibuprofen 600 MG tablet  Commonly known as:  ADVIL,MOTRIN  Take 1 tablet (600 mg total) by mouth every 6 (six) hours as needed.     prenatal multivitamin Tabs tablet  Take 1 tablet by mouth daily.     sertraline 50 MG tablet  Commonly known as:  ZOLOFT  Take 1 tablet (50 mg total) by mouth daily.        Diet: routine diet  Activity: Advance as tolerated.  Pelvic rest for 6 weeks.   Outpatient follow up:2 weeks Follow up Appt:No future appointments. Follow up Visit:No Follow-up on file.  Postpartum contraception: Abstinence  Newborn Data: Live born female  Birth Weight: 7 lb 2.6 oz (3249 g) APGAR: 9, 9  Baby Feeding: Breast Disposition:home with mother   12/26/2014 Jessee Avers., MD

## 2014-12-26 NOTE — Progress Notes (Signed)
Patient discharged home with significant other... Discharge instructions reviewed with patient and she verbalized understanding... Condition stable... No equipment... Ambulated to car with E. Katye Valek, RN.  

## 2015-11-02 DIAGNOSIS — R102 Pelvic and perineal pain: Secondary | ICD-10-CM | POA: Diagnosis not present

## 2015-11-02 DIAGNOSIS — R1031 Right lower quadrant pain: Secondary | ICD-10-CM | POA: Diagnosis not present

## 2015-11-08 DIAGNOSIS — R102 Pelvic and perineal pain: Secondary | ICD-10-CM | POA: Diagnosis not present

## 2016-01-01 ENCOUNTER — Other Ambulatory Visit (HOSPITAL_COMMUNITY)
Admission: RE | Admit: 2016-01-01 | Discharge: 2016-01-01 | Disposition: A | Discharge status: Home / Self Care | Payer: BLUE CROSS/BLUE SHIELD | Source: Ambulatory Visit | Attending: Obstetrics and Gynecology | Admitting: Obstetrics and Gynecology

## 2016-01-01 ENCOUNTER — Other Ambulatory Visit: Payer: Self-pay | Admitting: Obstetrics and Gynecology

## 2016-01-01 DIAGNOSIS — Z01419 Encounter for gynecological examination (general) (routine) without abnormal findings: Secondary | ICD-10-CM | POA: Insufficient documentation

## 2016-01-01 DIAGNOSIS — Z1151 Encounter for screening for human papillomavirus (HPV): Secondary | ICD-10-CM | POA: Insufficient documentation

## 2016-01-01 DIAGNOSIS — N83202 Unspecified ovarian cyst, left side: Secondary | ICD-10-CM | POA: Diagnosis not present

## 2016-01-04 LAB — CYTOLOGY - PAP
Diagnosis: NEGATIVE
HPV (WINDOPATH): NOT DETECTED

## 2016-03-03 DIAGNOSIS — Z3480 Encounter for supervision of other normal pregnancy, unspecified trimester: Secondary | ICD-10-CM | POA: Diagnosis not present

## 2016-03-03 DIAGNOSIS — Z3201 Encounter for pregnancy test, result positive: Secondary | ICD-10-CM | POA: Diagnosis not present

## 2016-03-03 LAB — OB RESULTS CONSOLE RUBELLA ANTIBODY, IGM: RUBELLA: IMMUNE

## 2016-03-03 LAB — OB RESULTS CONSOLE HIV ANTIBODY (ROUTINE TESTING): HIV: NONREACTIVE

## 2016-03-03 LAB — OB RESULTS CONSOLE HEPATITIS B SURFACE ANTIGEN: HEP B S AG: NEGATIVE

## 2016-03-19 DIAGNOSIS — Z3481 Encounter for supervision of other normal pregnancy, first trimester: Secondary | ICD-10-CM | POA: Diagnosis not present

## 2016-03-19 LAB — OB RESULTS CONSOLE GC/CHLAMYDIA
Chlamydia: NEGATIVE
Gonorrhea: NEGATIVE

## 2016-04-30 DIAGNOSIS — Z3A18 18 weeks gestation of pregnancy: Secondary | ICD-10-CM | POA: Diagnosis not present

## 2016-04-30 DIAGNOSIS — Z36 Encounter for antenatal screening for chromosomal anomalies: Secondary | ICD-10-CM | POA: Diagnosis not present

## 2016-04-30 DIAGNOSIS — Z3482 Encounter for supervision of other normal pregnancy, second trimester: Secondary | ICD-10-CM | POA: Diagnosis not present

## 2016-07-04 DIAGNOSIS — Z3483 Encounter for supervision of other normal pregnancy, third trimester: Secondary | ICD-10-CM | POA: Diagnosis not present

## 2016-08-21 DIAGNOSIS — Z23 Encounter for immunization: Secondary | ICD-10-CM | POA: Diagnosis not present

## 2016-08-23 LAB — OB RESULTS CONSOLE GBS: STREP GROUP B AG: POSITIVE

## 2016-09-02 DIAGNOSIS — Z3483 Encounter for supervision of other normal pregnancy, third trimester: Secondary | ICD-10-CM | POA: Diagnosis not present

## 2016-09-02 LAB — OB RESULTS CONSOLE GBS: STREP GROUP B AG: POSITIVE

## 2016-09-16 DIAGNOSIS — O368131 Decreased fetal movements, third trimester, fetus 1: Secondary | ICD-10-CM | POA: Diagnosis not present

## 2016-09-22 ENCOUNTER — Encounter (HOSPITAL_COMMUNITY): Payer: Self-pay

## 2016-09-22 ENCOUNTER — Inpatient Hospital Stay (HOSPITAL_COMMUNITY)
Admission: AD | Admit: 2016-09-22 | Discharge: 2016-09-24 | DRG: 775 | Disposition: A | Discharge status: Home / Self Care | Payer: BLUE CROSS/BLUE SHIELD | Source: Ambulatory Visit | Attending: Obstetrics & Gynecology | Admitting: Obstetrics & Gynecology

## 2016-09-22 DIAGNOSIS — Z3A39 39 weeks gestation of pregnancy: Secondary | ICD-10-CM | POA: Diagnosis not present

## 2016-09-22 DIAGNOSIS — O99824 Streptococcus B carrier state complicating childbirth: Principal | ICD-10-CM | POA: Diagnosis present

## 2016-09-22 DIAGNOSIS — Z3493 Encounter for supervision of normal pregnancy, unspecified, third trimester: Secondary | ICD-10-CM | POA: Diagnosis not present

## 2016-09-22 LAB — OB RESULTS CONSOLE RPR: RPR: NONREACTIVE

## 2016-09-22 MED ORDER — SENNOSIDES-DOCUSATE SODIUM 8.6-50 MG PO TABS
2.0000 | ORAL_TABLET | ORAL | Status: DC
  Administered 2016-09-22 – 2016-09-24 (×2): 2 via ORAL
  Filled 2016-09-22 (×2): qty 2

## 2016-09-22 MED ORDER — ONDANSETRON HCL 4 MG PO TABS: 4.0000 mg | ORAL_TABLET | ORAL | Status: DC | PRN

## 2016-09-22 MED ORDER — PRENATAL MULTIVITAMIN CH
1.0000 | ORAL_TABLET | Freq: Every day | ORAL | Status: DC
  Administered 2016-09-22: 1 via ORAL
  Filled 2016-09-22: qty 1

## 2016-09-22 MED ORDER — ONDANSETRON HCL 4 MG/2ML IJ SOLN: 4.0000 mg | INTRAMUSCULAR | Status: DC | PRN

## 2016-09-22 MED ORDER — WITCH HAZEL-GLYCERIN EX PADS: 1.0000 "application " | MEDICATED_PAD | CUTANEOUS | Status: DC | PRN

## 2016-09-22 MED ORDER — LIDOCAINE HCL (PF) 1 % IJ SOLN
INTRAMUSCULAR | Status: AC
  Administered 2016-09-22: 07:00:00 via SUBCUTANEOUS
  Filled 2016-09-22: qty 30

## 2016-09-22 MED ORDER — BENZOCAINE-MENTHOL 20-0.5 % EX AERO
1.0000 "application " | INHALATION_SPRAY | CUTANEOUS | Status: DC | PRN
  Administered 2016-09-23: 1 via TOPICAL
  Filled 2016-09-22: qty 56

## 2016-09-22 MED ORDER — COCONUT OIL OIL
1.0000 "application " | TOPICAL_OIL | Status: DC | PRN
  Administered 2016-09-23: 1 via TOPICAL
  Filled 2016-09-22: qty 120

## 2016-09-22 MED ORDER — IBUPROFEN 600 MG PO TABS
600.0000 mg | ORAL_TABLET | Freq: Four times a day (QID) | ORAL | Status: DC
  Filled 2016-09-22 (×2): qty 1

## 2016-09-22 MED ORDER — DIPHENHYDRAMINE HCL 25 MG PO CAPS: 25.0000 mg | ORAL_CAPSULE | Freq: Four times a day (QID) | ORAL | Status: DC | PRN

## 2016-09-22 MED ORDER — SIMETHICONE 80 MG PO CHEW: 80.0000 mg | CHEWABLE_TABLET | ORAL | Status: DC | PRN

## 2016-09-22 MED ORDER — ZOLPIDEM TARTRATE 5 MG PO TABS: 5.0000 mg | ORAL_TABLET | Freq: Every evening | ORAL | Status: DC | PRN

## 2016-09-22 MED ORDER — ACETAMINOPHEN 325 MG PO TABS: 650.0000 mg | ORAL_TABLET | ORAL | Status: DC | PRN

## 2016-09-22 MED ORDER — DIBUCAINE 1 % RE OINT: 1.0000 "application " | TOPICAL_OINTMENT | RECTAL | Status: DC | PRN

## 2016-09-22 NOTE — H&P (Signed)
HPI: 36 y/o [id number] @ 5696w3d estimated gestational age (as dated by LMP c/w 20 week ultrasound) presents complaining of painful contractions since around 4:30 am.   no Leaking of Fluid,   no Vaginal Bleeding,   + Uterine Contractions,  + Fetal Movement.  Prenatal care has been provided by Dr. Gerald Leitzara Cole  ROS: no HA, no epigastric pain, no visual changes.    Pregnancy uncomplicated   Prenatal Transfer Tool  Maternal Diabetes: No Genetic Screening: Normal Maternal Ultrasounds/Referrals: Normal Fetal Ultrasounds or other Referrals:  None Maternal Substance Abuse:  No Significant Maternal Medications:  None Significant Maternal Lab Results: Lab values include: Group B Strep positive   PNL:  GBS positive, Rub Immune, Hep B neg, RPR NR, HIV neg, GC/C neg, glucola:normal H/H: 10.6 Blood type: O pos, antibody neg  Immunizations: Tdap: 8/2  OBHx: FTNSVD x 5, 6#8-7#- uncomplicated, TAB x 1, SAB x1 PMHx:  none Meds:  PNV, Allergy:  No Known Allergies SurgHx: none SocHx:   no Tobacco, no  EtOH, no Illicit Drugs  O: BP 114/60   Pulse 90   Resp 20   Breastfeeding? Unknown  Gen. AAOx3, NAD Resp. Normal respiratory effor Abd. Gravid,  no tenderness,  no rigidity,  no guarding Extr.  no edema B/L  FHT: 140bpm SVE: Upon arrival SROM clear fluid, C/C/+1 and pt actively pushing.  At 6:44 AM a viable female was delivered via Vaginal, Spontaneous Delivery (Presentation:LOA ;  ).  APGAR: 8, 9; weight pending .   Placenta status: to L&D .  Cord:  with the following complications: none Anesthesia:  Lidocaine Episiotomy: None Lacerations: 1st degree Suture Repair: 2.0 vicryl Est. Blood Loss (mL): 150  Mom to postpartum.  Baby to Couplet care / Skin to Skin.  Myna HidalgoZAN, Kaushal Vannice, M 09/22/2016, 7:42 AM

## 2016-09-22 NOTE — Lactation Note (Signed)
This note was copied from a baby's chart. Lactation Consultation Note  Patient Name: Molly Silva WGNFA'OToday's Date: 09/22/2016 Reason for consult: Initial assessment [facility] is 9 hours old & [facility]. Baby was born at 3129w3d and weighed 8 lbs 9.9 oz at birth. Mom was finishing BF when LC entered. Mom reports BF is going well and that she has a lot of experience (BF 5 other children ranging from 6 months to 17 months).  Provided mom with BF booklet, BF resources, and feeding log; mom made aware of O/P services, breastfeeding support groups, community resources, and our phone # for post-discharge questions. Mom encouraged to feed baby 8-12 times/24 hours and with feeding cues. Reviewed importance of skin to skin, hand expression, and engorgement prevention/ care. Mom reports no questions at this time. Encouraged mom to ask for help as needed.   Maternal Data Does the patient have breastfeeding experience prior to this delivery?: Yes  Feeding Feeding Type: Breast Fed  LATCH Score Latch: Grasps breast easily, tongue down, lips flanged, rhythmical sucking.  Audible Swallowing: A few with stimulation  Type of Nipple: Everted at rest and after stimulation  Comfort (Breast/Nipple): Soft / non-tender  Hold (Positioning): No assistance needed to correctly position infant at breast.  LATCH Score: 9  Interventions Interventions: Breast feeding basics reviewed  Lactation Tools Discussed/Used     Consult Status Consult Status: Follow-up Date: 09/23/16 Follow-up type: In-patient    Oneal GroutLaura C Raissa Dam 09/22/2016, 4:28 PM

## 2016-09-23 LAB — CBC
HEMATOCRIT: 32 % — AB (ref 36.0–46.0)
HEMOGLOBIN: 10.6 g/dL — AB (ref 12.0–15.0)
MCH: 27.7 pg (ref 26.0–34.0)
MCHC: 33.1 g/dL (ref 30.0–36.0)
MCV: 83.8 fL (ref 78.0–100.0)
Platelets: 164 10*3/uL (ref 150–400)
RBC: 3.82 MIL/uL — AB (ref 3.87–5.11)
RDW: 14.1 % (ref 11.5–15.5)
WBC: 13.5 10*3/uL — ABNORMAL HIGH (ref 4.0–10.5)

## 2016-09-23 LAB — RPR: RPR: NONREACTIVE

## 2016-09-23 NOTE — Progress Notes (Signed)
Post Partum Day 1 Subjective: no complaints, up ad lib, voiding and tolerating PO  Objective: Blood pressure 101/67, pulse 78, temperature 98.4 F (36.9 C), temperature source Oral, resp. rate 18, SpO2 100 %, unknown if currently breastfeeding.  Physical Exam:  General: alert, cooperative and no distress Lochia: appropriate Uterine Fundus: firm Incision: NA DVT Evaluation: No evidence of DVT seen on physical exam.   Recent Labs  09/23/16 0534  HGB 10.6*  HCT 32.0*    Assessment/Plan: Plan for discharge tomorrow and Breastfeeding  Routine postartum care   LOS: 1 day   Juli Odom J. 09/23/2016, 2:01 PM

## 2016-09-23 NOTE — Progress Notes (Signed)
CSW acknowledges consult.  CSW attempted to meet with MOB, however MOB had several room guest.  MOB was also attending to infant and expressed concerns regarding infant appearing to be congested.  CSW updated MOB's bedside nurse of MOB's concern.  CSW will attempt to visit with MOB at a later time.   Ellysa Parrack Boyd-Gilyard, MSW, LCSW Clinical Social Work [phone]  

## 2016-09-23 NOTE — Progress Notes (Signed)
CSW attempted to meet with MOB to offer support and complete assessment due to hx of postpartum depression noted in RN Admission Summary, however, she had visitors (numerous children) and asked CSW to return at a later time.  CSW will attempt to meet with MOB again at a later time.

## 2016-09-23 NOTE — Progress Notes (Signed)
Patient declines medicine. Pain meds will be held until patient wants them. She was told to call when she needed meds.

## 2016-09-24 MED ORDER — IBUPROFEN 600 MG PO TABS: 600.0000 mg | ORAL_TABLET | Freq: Four times a day (QID) | ORAL | 1 refills | Status: DC | PRN

## 2016-09-24 NOTE — Clinical Social Work Maternal (Signed)
CLINICAL SOCIAL WORK MATERNAL/CHILD NOTE  Patient Details  Name: Molly Silva MRN: [id number] Date of Birth: 06/09/1980  Date:  09/24/2016  Clinical Social Worker Initiating Note:   , LCSW Date/ Time Initiated:  09/24/16/1030     Child's Name:  Molly Silva   Legal Guardian:  Other (Comment) (Parents: Seretha and Molly "Reginald" Ibrahim)   Need for Interpreter:  None   Date of Referral:  09/23/16     Reason for Referral:  Other (Comment) (Hx of Postpartum Depression)   Referral Source:  Central Nursery   Address:  [address], [place], [place] [address]  Phone number:  [phone]   Household Members:  Minor Children, Spouse   Natural Supports (not living in the home):  Immediate Family, Extended Family (MOB reports that her parents are extremely supportive.)   Professional Supports: None (MOB reports that she has had counseling in the past and is interested in starting again.)   Employment:     Type of Work: MOB works for [employer] at [employer] and FOB works for [employer]   Education:      Financial Resources:  Private Insurance   Other Resources:      Cultural/Religious Considerations Which May Impact Care: None stated.  Strengths:  Pediatrician chosen , Ability to meet basic needs , Home prepared for child  (Pediatric follow up will be with Dr. Quinlan)   Risk Factors/Current Problems:  Mental Health Concerns  (Hx of Postpartum Depression after last 2 deliveries (3 years ago and 1 year ago).)   Cognitive State:  Able to Concentrate , Linear Thinking , Alert , Insightful , Goal Oriented    Mood/Affect:  Comfortable , Calm , Euthymic , Interested    CSW Assessment: CSW met with MOB in her first floor room/113 to offer support and complete assessment due to hx of PMADs.  MOB was pleasant, welcoming and receptive to CSW's visit.  CSW found MOB to be easy to engage.  Bonding is evident in how she held and comforted infant  while we spoke. MOB reports that labor was extremely fast and that "we barely made it."  She states she delivered in MAU.  She states that this was her 6th pregnancy and the most physically challenging of all of them.  She reports that she stayed busy and reports feeling that this helped keep her spirits up.  MOB informed MOB that she works as a Ticket Agent at [employer] and spent most of the pregnancy in [place] with her parents and children.  She states she flew home for appointments.  Her husband stayed in [place] the majority of the time, as he works locally for the [employer].  She states he flew to [place] at times as well.  She reports it was nice to have the support of her retired mother both during the pregnancy and now.  She states her mother is caring for her other children at this time and plans to stay until next Wednesday.   MOB reports that she identified symptoms of postpartum depression after her last two deliveries, stating that she often did not feel like getting out of bed.  She states her symptoms often interfered with daily life and enjoying her newborns/family.  She took Zoloft and had therapy in the past.  She states she is interested in restarting therapy and is "not a fan of medicating."  She requested resources for counseling in the area as she is not   interested in returning to the counselor she previously saw.  CSW recommends searching for a counselor either on her insurance company's website, on [employer] website or suggests looking into the EAP through her employer.  MOB was appreciative of the suggestions and plans to do this right away.  She was attentive as CSW reviewed signs and symptoms of PMADs and agreed to self-evaluate her mental/emotional health using the [place] Postnatal Depression Scale in her materials from the hospital as well as a New Mom Checklist provided by CSW.  CSW also provided her with information about support groups held at [place] thanked CSW and states no questions, concerns or needs at this time.  CSW Plan/Description:  No Further Intervention Required/No Barriers to Discharge, Information/Referral to [employer] , Patient/Family Education     Terri Piedra [place], [place] 09/24/2016, 11:52 AM

## 2016-09-24 NOTE — Discharge Summary (Signed)
OB Discharge Summary     Patient Name: Molly Silva DOB: [birth year] MRN: [id number]  Date of admission: 09/22/2016 Delivering MD: Myna HidalgoZAN, JENNIFER   Date of discharge: 09/24/2016  Admitting diagnosis: 40 WEEKS CTX Intrauterine pregnancy: 4816w3d     Secondary diagnosis:  Active Problems:   Labor and delivery, indication for care  Additional problems: None     Discharge diagnosis: Term Pregnancy Delivered                                                                                                Post partum procedures:None  Augmentation: none  Complications: None  Hospital course:  Onset of Labor With Vaginal Delivery     36 y.o. yo [id number] at 6416w3d was admitted in Active Labor on 09/22/2016. Patient had an uncomplicated labor course as follows:  Membrane Rupture Time/Date: 6:43 AM ,09/22/2016   Intrapartum Procedures: Episiotomy: None [1]                                         Lacerations:  1st degree [2]  Patient had a delivery of a Viable infant. 09/22/2016  Information for the patient's newborn:  Telford Silva, Boy Molly [[id number]][[id number]]  Delivery Method: Vaginal, Spontaneous Delivery (Filed from Delivery Summary)    Pateint had an uncomplicated postpartum course.  She is ambulating, tolerating a regular diet, passing flatus, and urinating well. Patient is discharged home in stable condition on 09/24/16.   Physical exam  Vitals:   09/23/16 0507 09/23/16 1803 09/23/16 2018 09/24/16 0648  BP: 101/67 118/71  109/72  Pulse: 78 80  96  Resp: 18 18  18   Temp: 98.4 F (36.9 C) 98.2 F (36.8 C)  98.2 F (36.8 C)  TempSrc: Oral Oral  Axillary  SpO2:    99%  Weight:   68.9 kg (152 lb)   Height:   5\' 6"  (1.676 m)    General: alert, cooperative and no distress Lochia: appropriate Uterine Fundus: firm Incision: N/A DVT Evaluation: No evidence of DVT seen on physical exam. Labs: Lab Results  Component Value Date   WBC 13.5 (H) 09/23/2016   HGB 10.6 (L) 09/23/2016   HCT 32.0 (L)  09/23/2016   MCV 83.8 09/23/2016   PLT 164 09/23/2016   No flowsheet data found.  Discharge instruction: per After Visit Summary and "Baby and Me Booklet".  After visit meds:  Allergies as of 09/24/2016   No Known Allergies     Medication List    TAKE these medications   ibuprofen 600 MG tablet Commonly known as:  ADVIL,MOTRIN Take 1 tablet (600 mg total) by mouth every 6 (six) hours as needed.   prenatal multivitamin Tabs tablet Take 1 tablet by mouth daily.            Discharge Care Instructions        Start     Ordered   09/24/16 0000  ibuprofen (ADVIL,MOTRIN) 600 MG tablet  Every 6 hours PRN     09/24/16  4098   09/24/16 0000  Call MD for:  temperature >100.4     09/24/16 0803   09/24/16 0000  Call MD for:  persistant nausea and vomiting     09/24/16 0803   09/24/16 0000  Call MD for:  severe uncontrolled pain     09/24/16 0803   09/24/16 0000  Call MD for:  difficulty breathing, headache or visual disturbances     09/24/16 0803   09/24/16 0000  Activity as tolerated     09/24/16 0803   09/24/16 0000  Sexual acrtivity    Comments:  Avoid sex for 6 weeks   09/24/16 0803   09/24/16 0000  Diet general     09/24/16 0803   09/22/16 0000  OB RESULTS CONSOLE RPR    Comments:  This external order was created through the Results Console.    09/22/16 1500   09/22/16 0000  OB RESULTS CONSOLE HIV antibody    Comments:  This external order was created through the Results Console.    09/22/16 1500   09/22/16 0000  OB RESULTS CONSOLE Rubella Antibody    Comments:  This external order was created through the Results Console.    09/22/16 1500   09/22/16 0000  OB RESULTS CONSOLE Hepatitis B surface antigen    Comments:  This external order was created through the Results Console.    09/22/16 1500   09/22/16 0000  OB RESULT CONSOLE Group B Strep    Comments:  This external order was created through the Results Console.   09/22/16 1513   09/22/16 0000  OB RESULT  CONSOLE Group B Strep    Comments:  This external order was created through the Results Console.   09/22/16 1513   09/22/16 0000  OB RESULTS CONSOLE GC/Chlamydia    Comments:  This external order was created through the Results Console.   09/22/16 1513      Diet: routine diet  Activity: Advance as tolerated. Pelvic rest for 6 weeks.   Outpatient follow up:2 weeks Follow up Appt:No future appointments. Follow up Visit:No Follow-up on file.  Postpartum contraception: Not Discussed  Newborn Data: Live born female  Birth Weight: 8 lb 9.9 oz (3910 g) APGAR: 8, 9  Baby Feeding: Breast Disposition:home with mother   09/24/2016 Jessee Avers., MD

## 2016-09-24 NOTE — Lactation Note (Signed)
This note was copied from a baby's chart. Lactation Consultation Note  Patient Name: Molly Silva WUJWJ'XToday's Date: 09/24/2016 Reason for consult: Follow-up assessment Baby is 7753 hours old ,  Per mom the baby last fed at 11:30 am for 15 mins and had 2 wets at that time.  Per mom breast are fuller and heavier. Nipples sensitive.  LC instructed mom on the use shells , and hand pump.  Also per mom her DEBP Benefit pump from the insurance company should  Be arriving soon. Sore nipple and engorgement prevention and tx reviewed.  Per  Mom used the shells with her other babies.  Presently baby is sleeping.  Mother informed of post-discharge support and given phone number to the lactation department, including services for phone call assistance; out-patient appointments; and breastfeeding support group. List of other breastfeeding resources in the community given in the handout. Encouraged mother to call for problems or concerns related to breastfeeding.  Maternal Data    Feeding Feeding Type: Breast Fed Length of feed: 15 min (per mom )  LATCH Score                   Interventions Interventions: Breast feeding basics reviewed  Lactation Tools Discussed/Used Tools: Shells;Pump Shell Type: Inverted Breast pump type: Manual Pump Review: Setup, frequency, and cleaning Initiated by:: MAI  Date initiated:: 09/24/16   Consult Status Consult Status: Complete Date: 09/24/16    Kathrin GreathouseMargaret Ann Monteen Toops 09/24/2016, 12:11 PM

## 2016-11-04 DIAGNOSIS — F53 Postpartum depression: Secondary | ICD-10-CM | POA: Diagnosis not present

## 2016-11-25 DIAGNOSIS — F53 Postpartum depression: Secondary | ICD-10-CM | POA: Diagnosis not present

## 2016-12-24 DIAGNOSIS — F53 Postpartum depression: Secondary | ICD-10-CM | POA: Diagnosis not present

## 2017-03-13 DIAGNOSIS — S6391XA Sprain of unspecified part of right wrist and hand, initial encounter: Secondary | ICD-10-CM | POA: Diagnosis not present

## 2017-03-13 DIAGNOSIS — S5001XA Contusion of right elbow, initial encounter: Secondary | ICD-10-CM | POA: Diagnosis not present

## 2017-03-13 DIAGNOSIS — S139XXA Sprain of joints and ligaments of unspecified parts of neck, initial encounter: Secondary | ICD-10-CM | POA: Diagnosis not present

## 2017-04-07 DIAGNOSIS — F329 Major depressive disorder, single episode, unspecified: Secondary | ICD-10-CM | POA: Diagnosis not present

## 2017-04-07 DIAGNOSIS — F419 Anxiety disorder, unspecified: Secondary | ICD-10-CM | POA: Diagnosis not present

## 2017-04-07 DIAGNOSIS — Z01419 Encounter for gynecological examination (general) (routine) without abnormal findings: Secondary | ICD-10-CM | POA: Diagnosis not present

## 2017-04-29 DIAGNOSIS — M542 Cervicalgia: Secondary | ICD-10-CM | POA: Diagnosis not present

## 2017-04-29 DIAGNOSIS — M25511 Pain in right shoulder: Secondary | ICD-10-CM | POA: Diagnosis not present

## 2017-04-29 DIAGNOSIS — M545 Low back pain: Secondary | ICD-10-CM | POA: Diagnosis not present

## 2017-05-13 DIAGNOSIS — M5412 Radiculopathy, cervical region: Secondary | ICD-10-CM | POA: Diagnosis not present

## 2017-05-13 DIAGNOSIS — S335XXA Sprain of ligaments of lumbar spine, initial encounter: Secondary | ICD-10-CM | POA: Diagnosis not present

## 2017-05-19 DIAGNOSIS — M542 Cervicalgia: Secondary | ICD-10-CM | POA: Diagnosis not present

## 2017-05-21 DIAGNOSIS — S139XXA Sprain of joints and ligaments of unspecified parts of neck, initial encounter: Secondary | ICD-10-CM | POA: Diagnosis not present

## 2017-06-04 DIAGNOSIS — M542 Cervicalgia: Secondary | ICD-10-CM | POA: Diagnosis not present

## 2017-06-04 DIAGNOSIS — M545 Low back pain: Secondary | ICD-10-CM | POA: Diagnosis not present

## 2017-06-04 DIAGNOSIS — M5412 Radiculopathy, cervical region: Secondary | ICD-10-CM | POA: Diagnosis not present

## 2017-06-11 DIAGNOSIS — M545 Low back pain: Secondary | ICD-10-CM | POA: Diagnosis not present

## 2017-06-11 DIAGNOSIS — M542 Cervicalgia: Secondary | ICD-10-CM | POA: Diagnosis not present

## 2017-06-11 DIAGNOSIS — M5412 Radiculopathy, cervical region: Secondary | ICD-10-CM | POA: Diagnosis not present

## 2017-06-16 DIAGNOSIS — M5412 Radiculopathy, cervical region: Secondary | ICD-10-CM | POA: Diagnosis not present

## 2017-06-16 DIAGNOSIS — M542 Cervicalgia: Secondary | ICD-10-CM | POA: Diagnosis not present

## 2017-06-16 DIAGNOSIS — M545 Low back pain: Secondary | ICD-10-CM | POA: Diagnosis not present

## 2017-06-18 DIAGNOSIS — M5412 Radiculopathy, cervical region: Secondary | ICD-10-CM | POA: Diagnosis not present

## 2017-06-18 DIAGNOSIS — M542 Cervicalgia: Secondary | ICD-10-CM | POA: Diagnosis not present

## 2017-06-18 DIAGNOSIS — M545 Low back pain: Secondary | ICD-10-CM | POA: Diagnosis not present

## 2017-06-23 DIAGNOSIS — M5412 Radiculopathy, cervical region: Secondary | ICD-10-CM | POA: Diagnosis not present

## 2017-06-23 DIAGNOSIS — M542 Cervicalgia: Secondary | ICD-10-CM | POA: Diagnosis not present

## 2017-06-23 DIAGNOSIS — M545 Low back pain: Secondary | ICD-10-CM | POA: Diagnosis not present

## 2017-06-25 DIAGNOSIS — M542 Cervicalgia: Secondary | ICD-10-CM | POA: Diagnosis not present

## 2017-06-25 DIAGNOSIS — M5412 Radiculopathy, cervical region: Secondary | ICD-10-CM | POA: Diagnosis not present

## 2017-06-25 DIAGNOSIS — M545 Low back pain: Secondary | ICD-10-CM | POA: Diagnosis not present

## 2017-06-30 DIAGNOSIS — M542 Cervicalgia: Secondary | ICD-10-CM | POA: Diagnosis not present

## 2017-06-30 DIAGNOSIS — M5412 Radiculopathy, cervical region: Secondary | ICD-10-CM | POA: Diagnosis not present

## 2017-06-30 DIAGNOSIS — M545 Low back pain: Secondary | ICD-10-CM | POA: Diagnosis not present

## 2017-07-02 DIAGNOSIS — M542 Cervicalgia: Secondary | ICD-10-CM | POA: Diagnosis not present

## 2017-07-02 DIAGNOSIS — M5412 Radiculopathy, cervical region: Secondary | ICD-10-CM | POA: Diagnosis not present

## 2017-07-02 DIAGNOSIS — M545 Low back pain: Secondary | ICD-10-CM | POA: Diagnosis not present

## 2017-07-07 DIAGNOSIS — M542 Cervicalgia: Secondary | ICD-10-CM | POA: Diagnosis not present

## 2017-07-07 DIAGNOSIS — M545 Low back pain: Secondary | ICD-10-CM | POA: Diagnosis not present

## 2017-07-07 DIAGNOSIS — M5412 Radiculopathy, cervical region: Secondary | ICD-10-CM | POA: Diagnosis not present

## 2017-07-09 DIAGNOSIS — M5412 Radiculopathy, cervical region: Secondary | ICD-10-CM | POA: Diagnosis not present

## 2017-07-09 DIAGNOSIS — M542 Cervicalgia: Secondary | ICD-10-CM | POA: Diagnosis not present

## 2017-07-09 DIAGNOSIS — M545 Low back pain: Secondary | ICD-10-CM | POA: Diagnosis not present

## 2017-07-10 DIAGNOSIS — F53 Postpartum depression: Secondary | ICD-10-CM | POA: Diagnosis not present

## 2017-07-14 DIAGNOSIS — M545 Low back pain: Secondary | ICD-10-CM | POA: Diagnosis not present

## 2017-07-14 DIAGNOSIS — M542 Cervicalgia: Secondary | ICD-10-CM | POA: Diagnosis not present

## 2017-07-14 DIAGNOSIS — M5412 Radiculopathy, cervical region: Secondary | ICD-10-CM | POA: Diagnosis not present

## 2017-07-16 DIAGNOSIS — M5412 Radiculopathy, cervical region: Secondary | ICD-10-CM | POA: Diagnosis not present

## 2017-07-16 DIAGNOSIS — M545 Low back pain: Secondary | ICD-10-CM | POA: Diagnosis not present

## 2017-07-16 DIAGNOSIS — M542 Cervicalgia: Secondary | ICD-10-CM | POA: Diagnosis not present

## 2017-07-20 DIAGNOSIS — M5412 Radiculopathy, cervical region: Secondary | ICD-10-CM | POA: Diagnosis not present

## 2017-07-20 DIAGNOSIS — M545 Low back pain: Secondary | ICD-10-CM | POA: Diagnosis not present

## 2017-07-20 DIAGNOSIS — M542 Cervicalgia: Secondary | ICD-10-CM | POA: Diagnosis not present

## 2017-07-21 DIAGNOSIS — M545 Low back pain: Secondary | ICD-10-CM | POA: Diagnosis not present

## 2017-07-21 DIAGNOSIS — M5412 Radiculopathy, cervical region: Secondary | ICD-10-CM | POA: Diagnosis not present

## 2017-07-21 DIAGNOSIS — M542 Cervicalgia: Secondary | ICD-10-CM | POA: Diagnosis not present

## 2017-08-04 DIAGNOSIS — M542 Cervicalgia: Secondary | ICD-10-CM | POA: Diagnosis not present

## 2017-08-04 DIAGNOSIS — M5412 Radiculopathy, cervical region: Secondary | ICD-10-CM | POA: Diagnosis not present

## 2017-08-04 DIAGNOSIS — M545 Low back pain: Secondary | ICD-10-CM | POA: Diagnosis not present

## 2017-08-06 DIAGNOSIS — M545 Low back pain: Secondary | ICD-10-CM | POA: Diagnosis not present

## 2017-08-06 DIAGNOSIS — M5412 Radiculopathy, cervical region: Secondary | ICD-10-CM | POA: Diagnosis not present

## 2017-08-06 DIAGNOSIS — M542 Cervicalgia: Secondary | ICD-10-CM | POA: Diagnosis not present

## 2017-08-13 DIAGNOSIS — M5412 Radiculopathy, cervical region: Secondary | ICD-10-CM | POA: Diagnosis not present

## 2017-08-13 DIAGNOSIS — M542 Cervicalgia: Secondary | ICD-10-CM | POA: Diagnosis not present

## 2017-08-13 DIAGNOSIS — M545 Low back pain: Secondary | ICD-10-CM | POA: Diagnosis not present

## 2017-08-14 DIAGNOSIS — M542 Cervicalgia: Secondary | ICD-10-CM | POA: Diagnosis not present

## 2017-08-14 DIAGNOSIS — M5412 Radiculopathy, cervical region: Secondary | ICD-10-CM | POA: Diagnosis not present

## 2017-08-14 DIAGNOSIS — M545 Low back pain: Secondary | ICD-10-CM | POA: Diagnosis not present

## 2017-09-22 DIAGNOSIS — S335XXA Sprain of ligaments of lumbar spine, initial encounter: Secondary | ICD-10-CM | POA: Diagnosis not present

## 2017-09-22 DIAGNOSIS — M7581 Other shoulder lesions, right shoulder: Secondary | ICD-10-CM | POA: Diagnosis not present

## 2017-09-22 DIAGNOSIS — S139XXA Sprain of joints and ligaments of unspecified parts of neck, initial encounter: Secondary | ICD-10-CM | POA: Diagnosis not present

## 2017-09-22 DIAGNOSIS — M79644 Pain in right finger(s): Secondary | ICD-10-CM | POA: Diagnosis not present

## 2017-11-06 DIAGNOSIS — M79644 Pain in right finger(s): Secondary | ICD-10-CM | POA: Diagnosis not present

## 2017-11-06 DIAGNOSIS — S63641A Sprain of metacarpophalangeal joint of right thumb, initial encounter: Secondary | ICD-10-CM | POA: Diagnosis not present

## 2017-12-07 DIAGNOSIS — S63641A Sprain of metacarpophalangeal joint of right thumb, initial encounter: Secondary | ICD-10-CM | POA: Diagnosis not present

## 2017-12-11 ENCOUNTER — Other Ambulatory Visit: Payer: Self-pay | Admitting: Orthopedic Surgery

## 2017-12-14 ENCOUNTER — Other Ambulatory Visit: Payer: Self-pay | Admitting: Orthopedic Surgery

## 2017-12-15 ENCOUNTER — Other Ambulatory Visit: Payer: Self-pay | Admitting: Orthopedic Surgery

## 2017-12-15 DIAGNOSIS — S63641A Sprain of metacarpophalangeal joint of right thumb, initial encounter: Secondary | ICD-10-CM

## 2018-01-05 ENCOUNTER — Ambulatory Visit
Admission: RE | Admit: 2018-01-05 | Discharge: 2018-01-05 | Disposition: A | Discharge status: Home / Self Care | Payer: BLUE CROSS/BLUE SHIELD | Source: Ambulatory Visit | Attending: Orthopedic Surgery | Admitting: Orthopedic Surgery

## 2018-01-05 DIAGNOSIS — S63641A Sprain of metacarpophalangeal joint of right thumb, initial encounter: Secondary | ICD-10-CM

## 2018-01-05 MED ORDER — IOPAMIDOL (ISOVUE-M 200) INJECTION 41%
1.0000 mL | Freq: Once | INTRAMUSCULAR | Status: AC
  Administered 2018-01-05: 1 mL via INTRA_ARTICULAR

## 2018-01-08 DIAGNOSIS — S63641D Sprain of metacarpophalangeal joint of right thumb, subsequent encounter: Secondary | ICD-10-CM | POA: Diagnosis not present

## 2018-01-28 ENCOUNTER — Other Ambulatory Visit: Payer: Self-pay | Admitting: Orthopedic Surgery

## 2018-03-03 ENCOUNTER — Other Ambulatory Visit: Payer: Self-pay

## 2018-03-03 ENCOUNTER — Encounter (HOSPITAL_BASED_OUTPATIENT_CLINIC_OR_DEPARTMENT_OTHER): Payer: Self-pay | Admitting: *Deleted

## 2018-03-10 ENCOUNTER — Encounter (HOSPITAL_BASED_OUTPATIENT_CLINIC_OR_DEPARTMENT_OTHER)
Admission: RE | Admit: 2018-03-10 | Discharge: 2018-03-10 | Disposition: A | Discharge status: Home / Self Care | Payer: BLUE CROSS/BLUE SHIELD | Source: Ambulatory Visit | Attending: Orthopedic Surgery | Admitting: Orthopedic Surgery

## 2018-03-10 DIAGNOSIS — Z01812 Encounter for preprocedural laboratory examination: Secondary | ICD-10-CM | POA: Insufficient documentation

## 2018-03-10 LAB — POCT PREGNANCY, URINE: PREG TEST UR: NEGATIVE

## 2018-03-11 ENCOUNTER — Ambulatory Visit (HOSPITAL_BASED_OUTPATIENT_CLINIC_OR_DEPARTMENT_OTHER): Payer: BLUE CROSS/BLUE SHIELD | Admitting: Anesthesiology

## 2018-03-11 ENCOUNTER — Encounter (HOSPITAL_BASED_OUTPATIENT_CLINIC_OR_DEPARTMENT_OTHER): Payer: Self-pay | Admitting: *Deleted

## 2018-03-11 ENCOUNTER — Other Ambulatory Visit: Payer: Self-pay

## 2018-03-11 ENCOUNTER — Ambulatory Visit (HOSPITAL_BASED_OUTPATIENT_CLINIC_OR_DEPARTMENT_OTHER)
Admission: RE | Admit: 2018-03-11 | Discharge: 2018-03-11 | Disposition: A | Discharge status: Home / Self Care | Payer: BLUE CROSS/BLUE SHIELD | Attending: Orthopedic Surgery | Admitting: Orthopedic Surgery

## 2018-03-11 ENCOUNTER — Encounter (HOSPITAL_BASED_OUTPATIENT_CLINIC_OR_DEPARTMENT_OTHER)
Admission: RE | Disposition: A | Discharge status: Home / Self Care | Payer: Self-pay | Source: Home / Self Care | Attending: Orthopedic Surgery

## 2018-03-11 DIAGNOSIS — S63641A Sprain of metacarpophalangeal joint of right thumb, initial encounter: Secondary | ICD-10-CM | POA: Insufficient documentation

## 2018-03-11 DIAGNOSIS — X58XXXA Exposure to other specified factors, initial encounter: Secondary | ICD-10-CM | POA: Diagnosis not present

## 2018-03-11 DIAGNOSIS — S5331XA Traumatic rupture of right ulnar collateral ligament, initial encounter: Secondary | ICD-10-CM | POA: Diagnosis not present

## 2018-03-11 HISTORY — PX: ULNAR COLLATERAL LIGAMENT REPAIR: SHX6159

## 2018-03-11 SURGERY — REPAIR, LIGAMENT, ULNAR COLLATERAL
Anesthesia: Regional | Site: Thumb | Laterality: Right

## 2018-03-11 MED ORDER — CHLORHEXIDINE GLUCONATE 4 % EX LIQD: 60.0000 mL | Freq: Once | CUTANEOUS | Status: DC

## 2018-03-11 MED ORDER — PROPOFOL 500 MG/50ML IV EMUL
INTRAVENOUS | Status: DC | PRN
  Administered 2018-03-11: 50 ug/kg/min via INTRAVENOUS

## 2018-03-11 MED ORDER — CEFAZOLIN SODIUM-DEXTROSE 2-4 GM/100ML-% IV SOLN
2.0000 g | INTRAVENOUS | Status: AC
  Administered 2018-03-11: 2 g via INTRAVENOUS

## 2018-03-11 MED ORDER — MIDAZOLAM HCL 2 MG/2ML IJ SOLN
1.0000 mg | INTRAMUSCULAR | Status: DC | PRN
  Administered 2018-03-11: 2 mg via INTRAVENOUS

## 2018-03-11 MED ORDER — CEFAZOLIN SODIUM-DEXTROSE 2-4 GM/100ML-% IV SOLN
INTRAVENOUS | Status: AC
  Filled 2018-03-11: qty 100

## 2018-03-11 MED ORDER — OXYCODONE HCL 5 MG/5ML PO SOLN: 5.0000 mg | Freq: Once | ORAL | Status: DC | PRN

## 2018-03-11 MED ORDER — MIDAZOLAM HCL 2 MG/2ML IJ SOLN
INTRAMUSCULAR | Status: AC
  Filled 2018-03-11: qty 2

## 2018-03-11 MED ORDER — HYDROMORPHONE HCL 1 MG/ML IJ SOLN: 0.2500 mg | INTRAMUSCULAR | Status: DC | PRN

## 2018-03-11 MED ORDER — BUPIVACAINE HCL (PF) 0.25 % IJ SOLN
INTRAMUSCULAR | Status: DC | PRN
  Administered 2018-03-11: 5 mL

## 2018-03-11 MED ORDER — HYDROCODONE-ACETAMINOPHEN 5-325 MG PO TABS: ORAL_TABLET | ORAL | 0 refills | Status: DC

## 2018-03-11 MED ORDER — FENTANYL CITRATE (PF) 100 MCG/2ML IJ SOLN
50.0000 ug | INTRAMUSCULAR | Status: DC | PRN
  Administered 2018-03-11 (×2): 50 ug via INTRAVENOUS

## 2018-03-11 MED ORDER — FENTANYL CITRATE (PF) 100 MCG/2ML IJ SOLN
INTRAMUSCULAR | Status: AC
  Filled 2018-03-11: qty 2

## 2018-03-11 MED ORDER — LACTATED RINGERS IV SOLN
INTRAVENOUS | Status: DC
  Administered 2018-03-11: 11:00:00 via INTRAVENOUS

## 2018-03-11 MED ORDER — PROMETHAZINE HCL 25 MG/ML IJ SOLN: 6.2500 mg | INTRAMUSCULAR | Status: DC | PRN

## 2018-03-11 MED ORDER — ONDANSETRON HCL 4 MG/2ML IJ SOLN
INTRAMUSCULAR | Status: DC | PRN
  Administered 2018-03-11: 4 mg via INTRAVENOUS

## 2018-03-11 MED ORDER — ACETAMINOPHEN 500 MG PO TABS
1000.0000 mg | ORAL_TABLET | Freq: Once | ORAL | Status: AC
  Administered 2018-03-11: 1000 mg via ORAL

## 2018-03-11 MED ORDER — SCOPOLAMINE 1 MG/3DAYS TD PT72: 1.0000 | MEDICATED_PATCH | Freq: Once | TRANSDERMAL | Status: DC | PRN

## 2018-03-11 MED ORDER — OXYCODONE HCL 5 MG PO TABS: 5.0000 mg | ORAL_TABLET | Freq: Once | ORAL | Status: DC | PRN

## 2018-03-11 MED ORDER — ACETAMINOPHEN 500 MG PO TABS
ORAL_TABLET | ORAL | Status: AC
  Filled 2018-03-11: qty 2

## 2018-03-11 MED ORDER — LIDOCAINE HCL (PF) 0.5 % IJ SOLN
INTRAMUSCULAR | Status: DC | PRN
  Administered 2018-03-11: 30 mL via INTRAVENOUS

## 2018-03-11 SURGICAL SUPPLY — 75 items
ANCHOR JUGGERKNOT 1.0 1DR 2-0 (Anchor) ×3 IMPLANT
BANDAGE ACE 3X5.8 VEL STRL LF (GAUZE/BANDAGES/DRESSINGS) IMPLANT
BENZOIN TINCTURE PRP APPL 2/3 (GAUZE/BANDAGES/DRESSINGS) ×3 IMPLANT
BLADE MINI RND TIP GREEN BEAV (BLADE) ×3 IMPLANT
BLADE SURG 15 STRL LF DISP TIS (BLADE) ×2 IMPLANT
BLADE SURG 15 STRL SS (BLADE) ×4
BNDG ELASTIC 2X5.8 VLCR STR LF (GAUZE/BANDAGES/DRESSINGS) ×3 IMPLANT
BNDG ESMARK 4X9 LF (GAUZE/BANDAGES/DRESSINGS) ×3 IMPLANT
BNDG GAUZE ELAST 4 BULKY (GAUZE/BANDAGES/DRESSINGS) ×3 IMPLANT
CATH ROBINSON RED A/P 8FR (CATHETERS) IMPLANT
CHLORAPREP W/TINT 26ML (MISCELLANEOUS) ×3 IMPLANT
CLOSURE WOUND 1/2 X4 (GAUZE/BANDAGES/DRESSINGS) ×1
CORD BIPOLAR FORCEPS 12FT (ELECTRODE) ×3 IMPLANT
COVER BACK TABLE 60X90IN (DRAPES) ×3 IMPLANT
COVER MAYO STAND STRL (DRAPES) ×3 IMPLANT
COVER WAND RF STERILE (DRAPES) IMPLANT
CUFF TOURNIQUET SINGLE 18IN (TOURNIQUET CUFF) ×3 IMPLANT
DECANTER SPIKE VIAL GLASS SM (MISCELLANEOUS) IMPLANT
DRAPE EXTREMITY T 121X128X90 (DISPOSABLE) ×3 IMPLANT
DRAPE OEC MINIVIEW 54X84 (DRAPES) IMPLANT
DRAPE SURG 17X23 STRL (DRAPES) ×3 IMPLANT
DRSG PAD ABDOMINAL 8X10 ST (GAUZE/BANDAGES/DRESSINGS) ×3 IMPLANT
GAUZE SPONGE 4X4 12PLY STRL (GAUZE/BANDAGES/DRESSINGS) ×3 IMPLANT
GAUZE XEROFORM 1X8 LF (GAUZE/BANDAGES/DRESSINGS) ×3 IMPLANT
GLOVE BIO SURGEON STRL SZ7.5 (GLOVE) ×3 IMPLANT
GLOVE BIOGEL PI IND STRL 8 (GLOVE) ×1 IMPLANT
GLOVE BIOGEL PI IND STRL 8.5 (GLOVE) ×1 IMPLANT
GLOVE BIOGEL PI INDICATOR 8 (GLOVE) ×2
GLOVE BIOGEL PI INDICATOR 8.5 (GLOVE) ×2
GLOVE SURG ORTHO 8.0 STRL STRW (GLOVE) IMPLANT
GOWN STRL REUS W/ TWL LRG LVL3 (GOWN DISPOSABLE) ×1 IMPLANT
GOWN STRL REUS W/TWL LRG LVL3 (GOWN DISPOSABLE) ×2
GOWN STRL REUS W/TWL XL LVL3 (GOWN DISPOSABLE) ×6 IMPLANT
K-WIRE .035X4 (WIRE) IMPLANT
NDL SAFETY ECLIPSE 18X1.5 (NEEDLE) IMPLANT
NEEDLE HYPO 18GX1.5 SHARP (NEEDLE)
NEEDLE HYPO 22GX1.5 SAFETY (NEEDLE) IMPLANT
NEEDLE HYPO 25X1 1.5 SAFETY (NEEDLE) IMPLANT
NEEDLE KEITH (NEEDLE) IMPLANT
NS IRRIG 1000ML POUR BTL (IV SOLUTION) ×3 IMPLANT
PACK BASIN DAY SURGERY FS (CUSTOM PROCEDURE TRAY) ×3 IMPLANT
PAD CAST 3X4 CTTN HI CHSV (CAST SUPPLIES) ×1 IMPLANT
PAD CAST 4YDX4 CTTN HI CHSV (CAST SUPPLIES) IMPLANT
PADDING CAST ABS 4INX4YD NS (CAST SUPPLIES) ×2
PADDING CAST ABS COTTON 4X4 ST (CAST SUPPLIES) ×1 IMPLANT
PADDING CAST COTTON 3X4 STRL (CAST SUPPLIES) ×2
PADDING CAST COTTON 4X4 STRL (CAST SUPPLIES)
PASSER SUT SWANSON 36MM LOOP (INSTRUMENTS) IMPLANT
SLEEVE SCD COMPRESS KNEE MED (MISCELLANEOUS) ×3 IMPLANT
SLING ARM FOAM STRAP LRG (SOFTGOODS) IMPLANT
SPLINT FAST PLASTER 5X30 (CAST SUPPLIES)
SPLINT PLASTER CAST FAST 5X30 (CAST SUPPLIES) IMPLANT
SPLINT PLASTER CAST XFAST 3X15 (CAST SUPPLIES) IMPLANT
SPLINT PLASTER XTRA FASTSET 3X (CAST SUPPLIES)
STOCKINETTE 4X48 STRL (DRAPES) ×3 IMPLANT
STRIP CLOSURE SKIN 1/2X4 (GAUZE/BANDAGES/DRESSINGS) ×2 IMPLANT
SUT ETHIBOND 3-0 V-5 (SUTURE) IMPLANT
SUT ETHILON 3 0 PS 1 (SUTURE) IMPLANT
SUT ETHILON 4 0 PS 2 18 (SUTURE) IMPLANT
SUT FIBERWIRE 2-0 18 17.9 3/8 (SUTURE)
SUT FIBERWIRE 3-0 18 TAPR NDL (SUTURE)
SUT MERSILENE 2.0 SH NDLE (SUTURE) IMPLANT
SUT MERSILENE 4 0 P 3 (SUTURE) IMPLANT
SUT MON AB 5-0 P3 18 (SUTURE) ×3 IMPLANT
SUT SILK 4 0 PS 2 (SUTURE) IMPLANT
SUT VIC AB 0 SH 27 (SUTURE) IMPLANT
SUT VIC AB 2-0 SH 27 (SUTURE)
SUT VIC AB 2-0 SH 27XBRD (SUTURE) IMPLANT
SUT VICRYL 4-0 PS2 18IN ABS (SUTURE) IMPLANT
SUTURE FIBERWR 2-0 18 17.9 3/8 (SUTURE) IMPLANT
SUTURE FIBERWR 3-0 18 TAPR NDL (SUTURE) IMPLANT
SYR BULB 3OZ (MISCELLANEOUS) ×3 IMPLANT
SYR CONTROL 10ML LL (SYRINGE) ×3 IMPLANT
TOWEL GREEN STERILE FF (TOWEL DISPOSABLE) ×6 IMPLANT
UNDERPAD 30X30 (UNDERPADS AND DIAPERS) ×3 IMPLANT

## 2018-03-11 NOTE — Op Note (Signed)
I assisted Surgeon(s) and Role:    Betha Loa, MD - Primary on the Procedure(s): RIGHT ULNAR COLLATERAL LIGAMENT REPAIR on 03/11/2018.  I provided assistance on this case as follows: setup, approach, identification and repair of the ligament closure and application of the dressings and splints.  Electronically signed by: Cindee Salt, MD Date: 03/11/2018 Time: 1:14 PM

## 2018-03-11 NOTE — Anesthesia Procedure Notes (Signed)
Anesthesia Regional Block: Bier block (IV Regional)   Pre-Anesthetic Checklist: ,, timeout performed, Correct Patient, Correct Site, Correct Laterality, Correct Procedure, Correct Position, site marked, Risks and benefits discussed,  Surgical consent,  Pre-op evaluation,  At surgeon's request and post-op pain management  Laterality: Right  Prep: alcohol swabs        Procedures:,,,,,,,, #20gu IV placed  Narrative:  CRNA: Chiron Campione M, CRNA      

## 2018-03-11 NOTE — Op Note (Signed)
NAME: Molly Silva [facility] MEDICAL RECORD NO: [id number] DATE OF BIRTH: November 03, 1980 FACILITY: [facility] LOCATION:  SURGERY CENTER PHYSICIAN: Tami Ribas, MD   OPERATIVE REPORT   DATE OF PROCEDURE: 03/11/18    PREOPERATIVE DIAGNOSIS:   Right thumb ulnar collateral ligament tear   POSTOPERATIVE DIAGNOSIS:   Right thumb ulnar collateral ligament tear   PROCEDURE:   Right thumb MP joint repair of ulnar collateral ligament   SURGEON:  Betha Loa, M.D.   ASSISTANT: Cindee Salt, MD   ANESTHESIA:  Bier block with sedation   INTRAVENOUS FLUIDS:  Per anesthesia flow sheet.   ESTIMATED BLOOD LOSS:  Minimal.   COMPLICATIONS:  None.   SPECIMENS:  none   TOURNIQUET TIME:    Total Tourniquet Time Documented: Upper Arm (Right) - 35 minutes Total: Upper Arm (Right) - 35 minutes    DISPOSITION:  Stable to PACU.   INDICATIONS: 38 year old female injured her right thumb ulnar collateral ligament.  She is tried treating this nonoperatively without lasting relief.  She wishes to have ulnar collateral ligament repair. Risks, benefits and alternatives of surgery were discussed including the risks of blood loss, infection, damage to nerves, vessels, tendons, ligaments, bone for surgery, need for additional surgery, complications with wound healing, continued pain, nonunion, malunion, stiffness.  She voiced understanding of these risks and elected to proceed.  OPERATIVE COURSE:  After being identified preoperatively by myself,  the patient and I agreed on the procedure and site of the procedure.  The surgical site was marked.  Surgical consent had been signed. She was given IV Ancef as preoperative antibiotic prophylaxis. She was transferred to the operating room and placed on the operating table in supine position with the Right upper extremity on an arm board.  Bier block anesthesia was induced by the anesthesiologist.  Right upper extremity was prepped and draped in normal sterile orthopedic  fashion.  A surgical pause was performed between the surgeons, anesthesia, and operating room staff and all were in agreement as to the patient, procedure, and site of procedure.  Tourniquet at the proximal aspect of the arm had been inflated for the Bier block.    Incision was made at the dorsum of the thumb MP joint carried in subcutaneous tissues by spreading technique.  Bipolar electrocautery was used to obtain hemostasis.  The abductor aponeurosis was sharply incised.  The capsule was incised.  The ulnar collateral was identified.  It had peeled from the insertion at the base of the proximal phalanx.  It was intact in its origin at the metacarpal head.  The footprint was roughened with the curette.  The articular cartilage was in good condition.  A 1.0 mm juggernaut suture anchor was placed into the footprint at the proximal phalanx base.  This was used to reapproximate the insertion of the ulnar collateral ligament back to the proximal phalanx.  This provided good stability at the MP joint.  Wound was copiously irrigated with sterile saline.  The capsule was repaired with a running chromic suture.  The attic draper neurosis was repaired with a running 5-0 Mersilene suture.  The skin was closed with a running subcuticular 5-0 Monocryl suture.  This was augmented with benzoin and Steri-Strips.  The area was injected with quarter percent plain Marcaine to aid in postoperative analgesia.  The wound was dressed with sterile 4 x 4's and wrapped with a Kerlix bandage.  Thumb spica splint was placed and wrapped with Kerlix and Ace bandage.  The tourniquet was  deflated at 35 minutes.  Fingertips were pink with brisk capillary refill after deflation of tourniquet.  The operative  drapes were broken down.  The patient was awoken from anesthesia safely.  She was transferred back to the stretcher and taken to PACU in stable condition.  I will see her back in the office in 1 week for postoperative followup.  I will give her  a prescription for Norco 5/325 1-2 tabs PO q6 hours prn pain, dispense # 20.   Betha Loa, MD Electronically signed, 03/11/18

## 2018-03-11 NOTE — Anesthesia Preprocedure Evaluation (Addendum)
Anesthesia Evaluation  Patient identified by MRN, date of birth, ID band Patient awake    Reviewed: Allergy & Precautions, NPO status , Patient's Chart, lab work & pertinent test results  Airway Mallampati: I  TM Distance: >3 FB Neck ROM: Full    Dental no notable dental hx.    Pulmonary neg pulmonary ROS,    Pulmonary exam normal breath sounds clear to auscultation       Cardiovascular negative cardio ROS Normal cardiovascular exam Rhythm:Regular Rate:Normal     Neuro/Psych PSYCHIATRIC DISORDERS Depression negative neurological ROS     GI/Hepatic negative GI ROS, Neg liver ROS,   Endo/Other  negative endocrine ROS  Renal/GU negative Renal ROS     Musculoskeletal negative musculoskeletal ROS (+)   Abdominal   Peds  Hematology negative hematology ROS (+)   Anesthesia Other Findings RIGHT THUMB ULNAR COLLATERAL LIGAMENT TEAR  Reproductive/Obstetrics hcg negative                            Anesthesia Physical Anesthesia Plan  ASA: I  Anesthesia Plan: Bier Block and Bier Block-LIDOCAINE ONLY   Post-op Pain Management:    Induction:   PONV Risk Score and Plan: 2 and Ondansetron, Dexamethasone, Propofol infusion, Treatment may vary due to age or medical condition and Midazolam  Airway Management Planned: Natural Airway  Additional Equipment:   Intra-op Plan:   Post-operative Plan:   Informed Consent: I have reviewed the patients History and Physical, chart, labs and discussed the procedure including the risks, benefits and alternatives for the proposed anesthesia with the patient or authorized representative who has indicated his/her understanding and acceptance.     Dental advisory given  Plan Discussed with: CRNA  Anesthesia Plan Comments:        Anesthesia Quick Evaluation

## 2018-03-11 NOTE — Anesthesia Procedure Notes (Signed)
Performed by: Jeree Delcid M, CRNA       

## 2018-03-11 NOTE — H&P (Signed)
Molly Silva is an 38 y.o. female.   Chief Complaint: right thumb ligament injury HPI: 38 yo female with right thumb UCL injury.  She has tried non operative treatment without relief.  She wishes to have right thumb UCL repair vs reconstruction.  Allergies: No Known Allergies  Past Medical History:  Diagnosis Date  . Abnormal Pap smear   . Anemia   . Post partum depression     Past Surgical History:  Procedure Laterality Date  . APPENDECTOMY  2009  . WISDOM TOOTH EXTRACTION      Family History: Family History  Problem Relation Age of Onset  . Hypertension Mother   . Diabetes Father     Social History:   reports that she has never smoked. She has never used smokeless tobacco. She reports that she does not drink alcohol or use drugs.  Medications: No medications prior to admission.    Results for orders placed or performed during the hospital encounter of 03/10/18 (from the past 48 hour(s))  Pregnancy, urine POC     Status: None   Collection Time: 03/10/18  8:22 AM  Result Value Ref Range   Preg Test, Ur NEGATIVE NEGATIVE    Comment:        THE SENSITIVITY OF THIS METHODOLOGY IS >24 mIU/mL     No results found.   A comprehensive review of systems was negative.  Height 5\' 6"  (1.676 m), weight 50.8 kg, last menstrual period 02/27/2018, not currently breastfeeding.  General appearance: alert, cooperative and appears stated age Head: Normocephalic, without obvious abnormality, atraumatic Neck: supple, symmetrical, trachea midline Cardio: regular rate and rhythm Resp: clear to auscultation bilaterally Extremities: Intact sensation and capillary refill all digits.  +epl/fpl/io.  No wounds.  Pulses: 2+ and symmetric Skin: Skin color, texture, turgor normal. No rashes or lesions Neurologic: Grossly normal Incision/Wound: none  Assessment/Plan Right thumb ulnar collateral ligament injury.  Non operative and operative treatment options have been discussed with the  patient and patient wishes to proceed with operative treatment. Risks, benefits, and alternatives of surgery have been discussed and the patient agrees with the plan of care.   Betha Loa 03/11/2018, 8:49 AM

## 2018-03-11 NOTE — Anesthesia Postprocedure Evaluation (Signed)
Anesthesia Post Note  Patient: Molly Silva  Procedure(s) Performed: RIGHT ULNAR COLLATERAL LIGAMENT REPAIR (Right Thumb)     Patient location during evaluation: PACU Anesthesia Type: Bier Block Level of consciousness: awake and alert Pain management: pain level controlled Vital Signs Assessment: post-procedure vital signs reviewed and stable Respiratory status: spontaneous breathing, nonlabored ventilation, respiratory function stable and patient connected to nasal cannula oxygen Cardiovascular status: stable and blood pressure returned to baseline Postop Assessment: no apparent nausea or vomiting Anesthetic complications: no    Last Vitals:  Vitals:   03/11/18 1345 03/11/18 1412  BP: 128/80 117/82  Pulse: (!) 58 63  Resp: 12 12  Temp:  36.5 C  SpO2: 100% 100%    Last Pain:  Vitals:   03/11/18 1412  TempSrc: Oral  PainSc: 3                  Cecilio Ohlrich P Christana Angelica

## 2018-03-11 NOTE — Discharge Instructions (Addendum)
No Tylenol before 5pm today Hand Center Instructions Hand Surgery  Wound Care: Keep your hand elevated above the level of your heart.  Do not allow it to dangle by your side.  Keep the dressing dry and do not remove it unless your doctor advises you to do so.  He will usually change it at the time of your post-op visit.  Moving your fingers is advised to stimulate circulation but will depend on the site of your surgery.  If you have a splint applied, your doctor will advise you regarding movement.  Activity: Do not drive or operate machinery today.  Rest today and then you may return to your normal activity and work as indicated by your physician.  Diet:  Drink liquids today or eat a light diet.  You may resume a regular diet tomorrow.    General expectations: Pain for two to three days. Fingers may become slightly swollen.  Call your doctor if any of the following occur: Severe pain not relieved by pain medication. Elevated temperature. Dressing soaked with blood. Inability to move fingers. White or bluish color to fingers.       Post Anesthesia Home Care Instructions  Activity: Get plenty of rest for the remainder of the day. A responsible individual must stay with you for 24 hours following the procedure.  For the next 24 hours, DO NOT: -Drive a car -Advertising copywriter -Drink alcoholic beverages -Take any medication unless instructed by your physician -Make any legal decisions or sign important papers.  Meals: Start with liquid foods such as gelatin or soup. Progress to regular foods as tolerated. Avoid greasy, spicy, heavy foods. If nausea and/or vomiting occur, drink only clear liquids until the nausea and/or vomiting subsides. Call your physician if vomiting continues.  Special Instructions/Symptoms: Your throat may feel dry or sore from the anesthesia or the breathing tube placed in your throat during surgery. If this causes discomfort, gargle with warm salt water.  The discomfort should disappear within 24 hours.  If you had a scopolamine patch placed behind your ear for the management of post- operative nausea and/or vomiting:  1. The medication in the patch is effective for 72 hours, after which it should be removed.  Wrap patch in a tissue and discard in the trash. Wash hands thoroughly with soap and water. 2. You may remove the patch earlier than 72 hours if you experience unpleasant side effects which may include dry mouth, dizziness or visual disturbances. 3. Avoid touching the patch. Wash your hands with soap and water after contact with the patch.

## 2018-03-11 NOTE — Transfer of Care (Signed)
Immediate Anesthesia Transfer of Care Note  Patient: Molly Silva  Procedure(s) Performed: RIGHT ULNAR COLLATERAL LIGAMENT REPAIR (Right Thumb)  Patient Location: PACU  Anesthesia Type:MAC and Regional  Level of Consciousness: awake, alert  and oriented  Airway & Oxygen Therapy: Patient Spontanous Breathing and Patient connected to face mask oxygen  Post-op Assessment: Report given to RN and Post -op Vital signs reviewed and stable  Post vital signs: Reviewed and stable  Last Vitals:  Vitals Value Taken Time  BP    Temp    Pulse 85 03/11/2018  1:16 PM  Resp    SpO2 100 % 03/11/2018  1:16 PM  Vitals shown include unvalidated device data.  Last Pain:  Vitals:   03/11/18 1031  TempSrc: Oral  PainSc: 7          Complications: No apparent anesthesia complications

## 2018-03-12 ENCOUNTER — Encounter (HOSPITAL_BASED_OUTPATIENT_CLINIC_OR_DEPARTMENT_OTHER): Payer: Self-pay | Admitting: Orthopedic Surgery

## 2018-03-17 DIAGNOSIS — S63641D Sprain of metacarpophalangeal joint of right thumb, subsequent encounter: Secondary | ICD-10-CM | POA: Diagnosis not present

## 2018-03-17 DIAGNOSIS — M79644 Pain in right finger(s): Secondary | ICD-10-CM | POA: Diagnosis not present

## 2018-03-17 DIAGNOSIS — M25641 Stiffness of right hand, not elsewhere classified: Secondary | ICD-10-CM | POA: Diagnosis not present

## 2018-05-18 DIAGNOSIS — M79644 Pain in right finger(s): Secondary | ICD-10-CM | POA: Diagnosis not present

## 2018-05-18 DIAGNOSIS — M25641 Stiffness of right hand, not elsewhere classified: Secondary | ICD-10-CM | POA: Diagnosis not present

## 2018-05-18 DIAGNOSIS — S63641D Sprain of metacarpophalangeal joint of right thumb, subsequent encounter: Secondary | ICD-10-CM | POA: Diagnosis not present

## 2018-07-02 DIAGNOSIS — S63641D Sprain of metacarpophalangeal joint of right thumb, subsequent encounter: Secondary | ICD-10-CM | POA: Diagnosis not present

## 2018-08-13 DIAGNOSIS — S63641D Sprain of metacarpophalangeal joint of right thumb, subsequent encounter: Secondary | ICD-10-CM | POA: Diagnosis not present

## 2018-10-15 DIAGNOSIS — S63641D Sprain of metacarpophalangeal joint of right thumb, subsequent encounter: Secondary | ICD-10-CM | POA: Diagnosis not present

## 2018-10-15 DIAGNOSIS — Z3481 Encounter for supervision of other normal pregnancy, first trimester: Secondary | ICD-10-CM | POA: Diagnosis not present

## 2018-10-15 DIAGNOSIS — Z3201 Encounter for pregnancy test, result positive: Secondary | ICD-10-CM | POA: Diagnosis not present

## 2018-10-27 ENCOUNTER — Other Ambulatory Visit: Payer: Self-pay | Admitting: Obstetrics and Gynecology

## 2018-10-27 ENCOUNTER — Other Ambulatory Visit (HOSPITAL_COMMUNITY)
Admission: RE | Admit: 2018-10-27 | Discharge: 2018-10-27 | Disposition: A | Discharge status: Home / Self Care | Payer: BC Managed Care – PPO | Source: Ambulatory Visit | Attending: Obstetrics and Gynecology | Admitting: Obstetrics and Gynecology

## 2018-10-27 DIAGNOSIS — Z3481 Encounter for supervision of other normal pregnancy, first trimester: Secondary | ICD-10-CM | POA: Diagnosis not present

## 2018-10-27 DIAGNOSIS — Z3490 Encounter for supervision of normal pregnancy, unspecified, unspecified trimester: Secondary | ICD-10-CM | POA: Insufficient documentation

## 2018-11-08 LAB — CYTOLOGY - PAP
Chlamydia: NEGATIVE
Comment: NEGATIVE
Comment: NEGATIVE
Comment: NORMAL
Diagnosis: NEGATIVE
High risk HPV: NEGATIVE
Neisseria Gonorrhea: NEGATIVE

## 2018-12-28 DIAGNOSIS — O09522 Supervision of elderly multigravida, second trimester: Secondary | ICD-10-CM | POA: Diagnosis not present

## 2018-12-28 DIAGNOSIS — Z3482 Encounter for supervision of other normal pregnancy, second trimester: Secondary | ICD-10-CM | POA: Diagnosis not present

## 2018-12-28 DIAGNOSIS — Z36 Encounter for antenatal screening for chromosomal anomalies: Secondary | ICD-10-CM | POA: Diagnosis not present

## 2019-01-21 NOTE — L&D Delivery Note (Signed)
Delivery Note  [facility] Pt    Patient Name: Molly Silva DOB: Jul 10, 1980 MRN: [id number]  Date of admission: 05/08/2019 Delivering MD: Dale Smithfield  Date of delivery: 05/09/19 Type of delivery: SVD  Newborn Data: Live born female  Birth Weight:   APGAR: 9, 9  Newborn Delivery   Birth date/time: 05/08/2019 23:41:00 Delivery type: Vaginal, Spontaneous    Molly Silva, 38 y.o., @ [redacted]w[redacted]d,  [id number], who was admitted for spontaneous active labor. I was called to the room when she progressed 2+ station in the second stage of labor.  She pushed for 1/min.  She delivered a viable infant, cephalic and restituted to the LOA position over an intact perineum.  A nuchal cord   was not identified. The baby was placed on maternal abdomen while initial step of NRP were perfmored (Dry, Stimulated, and warmed). Hat placed on baby for thermoregulation. Delayed cord clamping was performed for 2 minutes.  Cord double clamped and cut.  Cord cut by father. Apgar scores were 9 and 9. Prophylactic Pitocin was started in the third stage of labor for active management. The placenta delivered spontaneously, shultz, with a 3 vessel cord and was sent to LD.  Inspection revealed none. An examination of the vaginal vault and cervix was free from lacerations. The uterus was firm, bleeding stable.   Placenta and umbilical artery blood gas were not sent.  There were no complications during the procedure.  Mom and baby skin to skin following delivery. Left in stable condition.  Maternal Info: Anesthesia:Natural Episiotomy: No Lacerations:  NO Suture Repair: NO Est. Blood Loss (mL):   Newborn Info:  Baby Sex: female  APGAR (1 MIN): 9   APGAR (5 MINS): 9   APGAR (10 MINS):     Mom to postpartum.  Baby to Couplet care / Skin to Skin.  Dr Mora Appl updated.    [place], [place], NP-C 05/09/19 12:01 AM

## 2019-03-01 DIAGNOSIS — Z3483 Encounter for supervision of other normal pregnancy, third trimester: Secondary | ICD-10-CM | POA: Diagnosis not present

## 2019-03-01 DIAGNOSIS — Z3490 Encounter for supervision of normal pregnancy, unspecified, unspecified trimester: Secondary | ICD-10-CM | POA: Diagnosis not present

## 2019-04-26 DIAGNOSIS — Z3490 Encounter for supervision of normal pregnancy, unspecified, unspecified trimester: Secondary | ICD-10-CM | POA: Diagnosis not present

## 2019-04-26 LAB — OB RESULTS CONSOLE GBS: GBS: NEGATIVE

## 2019-05-06 DIAGNOSIS — Z3483 Encounter for supervision of other normal pregnancy, third trimester: Secondary | ICD-10-CM | POA: Diagnosis not present

## 2019-05-08 ENCOUNTER — Inpatient Hospital Stay (HOSPITAL_COMMUNITY)
Admission: AD | Admit: 2019-05-08 | Discharge: 2019-05-10 | DRG: 807 | Disposition: A | Discharge status: Home / Self Care | Payer: BC Managed Care – PPO | Attending: Obstetrics and Gynecology | Admitting: Obstetrics and Gynecology

## 2019-05-08 ENCOUNTER — Observation Stay (HOSPITAL_COMMUNITY)
Admission: AD | Admit: 2019-05-08 | Discharge: 2019-05-08 | Disposition: A | Discharge status: Home / Self Care | Payer: BC Managed Care – PPO | Source: Home / Self Care | Attending: Obstetrics & Gynecology | Admitting: Obstetrics & Gynecology

## 2019-05-08 ENCOUNTER — Encounter (HOSPITAL_COMMUNITY): Payer: Self-pay | Admitting: Obstetrics & Gynecology

## 2019-05-08 ENCOUNTER — Other Ambulatory Visit: Payer: Self-pay

## 2019-05-08 DIAGNOSIS — Z20822 Contact with and (suspected) exposure to covid-19: Secondary | ICD-10-CM | POA: Diagnosis present

## 2019-05-08 DIAGNOSIS — Z8249 Family history of ischemic heart disease and other diseases of the circulatory system: Secondary | ICD-10-CM | POA: Insufficient documentation

## 2019-05-08 DIAGNOSIS — O26893 Other specified pregnancy related conditions, third trimester: Secondary | ICD-10-CM | POA: Diagnosis not present

## 2019-05-08 DIAGNOSIS — Z833 Family history of diabetes mellitus: Secondary | ICD-10-CM | POA: Insufficient documentation

## 2019-05-08 DIAGNOSIS — Z3A38 38 weeks gestation of pregnancy: Secondary | ICD-10-CM

## 2019-05-08 DIAGNOSIS — O09523 Supervision of elderly multigravida, third trimester: Secondary | ICD-10-CM | POA: Insufficient documentation

## 2019-05-08 DIAGNOSIS — O471 False labor at or after 37 completed weeks of gestation: Secondary | ICD-10-CM | POA: Insufficient documentation

## 2019-05-08 DIAGNOSIS — O479 False labor, unspecified: Secondary | ICD-10-CM | POA: Diagnosis present

## 2019-05-08 DIAGNOSIS — Z791 Long term (current) use of non-steroidal anti-inflammatories (NSAID): Secondary | ICD-10-CM | POA: Insufficient documentation

## 2019-05-08 LAB — CBC
HCT: 31.6 % — ABNORMAL LOW (ref 36.0–46.0)
HCT: 32.5 % — ABNORMAL LOW (ref 36.0–46.0)
Hemoglobin: 10 g/dL — ABNORMAL LOW (ref 12.0–15.0)
Hemoglobin: 10 g/dL — ABNORMAL LOW (ref 12.0–15.0)
MCH: 26.7 pg (ref 26.0–34.0)
MCH: 27.5 pg (ref 26.0–34.0)
MCHC: 30.8 g/dL (ref 30.0–36.0)
MCHC: 31.6 g/dL (ref 30.0–36.0)
MCV: 86.7 fL (ref 80.0–100.0)
MCV: 86.8 fL (ref 80.0–100.0)
Platelets: 194 10*3/uL (ref 150–400)
Platelets: 200 10*3/uL (ref 150–400)
RBC: 3.64 MIL/uL — ABNORMAL LOW (ref 3.87–5.11)
RBC: 3.75 MIL/uL — ABNORMAL LOW (ref 3.87–5.11)
RDW: 14.1 % (ref 11.5–15.5)
RDW: 14.2 % (ref 11.5–15.5)
WBC: 11.1 10*3/uL — ABNORMAL HIGH (ref 4.0–10.5)
WBC: 15.3 10*3/uL — ABNORMAL HIGH (ref 4.0–10.5)
nRBC: 0 % (ref 0.0–0.2)
nRBC: 0 % (ref 0.0–0.2)

## 2019-05-08 LAB — RESPIRATORY PANEL BY RT PCR (FLU A&B, COVID)
Influenza A by PCR: NEGATIVE
Influenza B by PCR: NEGATIVE
SARS Coronavirus 2 by RT PCR: NEGATIVE

## 2019-05-08 LAB — TYPE AND SCREEN
ABO/RH(D): O POS
Antibody Screen: NEGATIVE

## 2019-05-08 LAB — ABO/RH: ABO/RH(D): O POS

## 2019-05-08 LAB — RPR: RPR Ser Ql: NONREACTIVE

## 2019-05-08 MED ORDER — OXYTOCIN BOLUS FROM INFUSION: 500.0000 mL | Freq: Once | INTRAVENOUS | Status: DC

## 2019-05-08 MED ORDER — FLEET ENEMA 7-19 GM/118ML RE ENEM: 1.0000 | ENEMA | RECTAL | Status: DC | PRN

## 2019-05-08 MED ORDER — LACTATED RINGERS IV SOLN: 500.0000 mL | INTRAVENOUS | Status: DC | PRN

## 2019-05-08 MED ORDER — ONDANSETRON HCL 4 MG/2ML IJ SOLN: 4.0000 mg | Freq: Four times a day (QID) | INTRAMUSCULAR | Status: DC | PRN

## 2019-05-08 MED ORDER — OXYCODONE-ACETAMINOPHEN 5-325 MG PO TABS: 1.0000 | ORAL_TABLET | ORAL | Status: DC | PRN

## 2019-05-08 MED ORDER — OXYCODONE-ACETAMINOPHEN 5-325 MG PO TABS: 2.0000 | ORAL_TABLET | ORAL | Status: DC | PRN

## 2019-05-08 MED ORDER — LACTATED RINGERS IV SOLN: INTRAVENOUS | Status: DC

## 2019-05-08 MED ORDER — SOD CITRATE-CITRIC ACID 500-334 MG/5ML PO SOLN: 30.0000 mL | ORAL | Status: DC | PRN

## 2019-05-08 MED ORDER — ACETAMINOPHEN 325 MG PO TABS: 650.0000 mg | ORAL_TABLET | ORAL | Status: DC | PRN

## 2019-05-08 MED ORDER — OXYTOCIN 40 UNITS IN NORMAL SALINE INFUSION - SIMPLE MED: 2.5000 [IU]/h | INTRAVENOUS | Status: DC

## 2019-05-08 MED ORDER — LIDOCAINE HCL (PF) 1 % IJ SOLN: 30.0000 mL | INTRAMUSCULAR | Status: DC | PRN

## 2019-05-08 MED ORDER — OXYTOCIN 40 UNITS IN NORMAL SALINE INFUSION - SIMPLE MED
INTRAVENOUS | Status: AC
  Filled 2019-05-08: qty 1000

## 2019-05-08 NOTE — Discharge Summary (Signed)
False labor OB Discharge Summary     Patient Name: Molly Silva DOB: 04/25/1980 MRN: [id number]  Date of admission: 05/08/2019  Admitting diagnosis: Labor and delivery indication for care or intervention [O75.9] Intrauterine pregnancy: [redacted]w[redacted]d     Secondary diagnosis:  Active Problems:   False labor                                 History of Present Illness: Ms. Molly Silva is a 39 y.o. female, [id number], who presents at [redacted]w[redacted]d weeks gestation. The patient has been followed at  [facility]  Her pregnancy has been complicated by:  Patient Active Problem List   Diagnosis Date Noted  . False labor 05/08/2019  . Labor and delivery, indication for care 09/22/2016  . Active labor at term 12/24/2014  . SVD (spontaneous vaginal delivery) 12/24/2014  . Normal pregnancy 07/02/2013  . Normal delivery 06/13/2011   Molly Silva a 39 y.o.female, [id number], IUP [id number] who started OB care with Dr Richardson Dopp at [facility] at 10.[redacted] weeks gestation, presenting forspontaneousearly labor, pt admits to having precipdeliveries and was noted x 1 day in the [place] office to be 1cmdilated, woke with cxtregular every 3-4 and bloody show. Pt worried she would not make it to the hospital in time.HGB at [facility] was 10.8-13.6 @ 28 weeks. Blood type O+. GBS-. Negative genetic testing. All labs unremarkable and negative GC/C/HIV/RPR NR. When pt was admitted to LD she cxt spaced and stop and in the first 4 hours the pt made 1cm cervical dilation, there after toco and pt described irritable uterus but no further dilation was seen ofver the 6 hours, pt not in labor, agreeable to be discharge home on labor precautions.   Physical exam  Vitals:   05/08/19 1000 05/08/19 1200 05/08/19 1445 05/08/19 1650  BP: 126/74 105/71 111/68 120/78  Pulse: (!) 113 93 90 (!) 106  Resp: 16 16 16 16   Temp:  98.1 F (36.7 C)    TempSrc:      Weight:      Height:       General: alert, cooperative and no  distress Lochia: Scant bloody show Uterine Fundus: soft DVT Evaluation: No evidence of DVT seen on physical exam. Negative Homan's sign. No cords or calf tenderness. No significant calf/ankle edema.  Labs: Lab Results  Component Value Date   WBC 11.1 (H) 05/08/2019   HGB 10.0 (L) 05/08/2019   HCT 32.5 (L) 05/08/2019   MCV 86.7 05/08/2019   PLT 200 05/08/2019   No flowsheet data found.  Date of discharge: 05/08/2019 Discharge Diagnoses: False labor-undelivered Discharge instruction: per After Visit Summary and "Baby and Me Booklet".  After visit meds:   Activity:           unrestricted Advance as tolerated. Pelvic rest for 6 weeks.  Diet:                routine Medications: PNV Condition:  Pt discharge to home in stable condition with labor precautions.    Meds: Allergies as of 05/08/2019   No Known Allergies     Medication List    STOP taking these medications   HYDROcodone-acetaminophen 5-325 MG tablet Commonly known as: Norco   ibuprofen 600 MG tablet Commonly known as: ADVIL     TAKE these medications   multivitamin-prenatal 27-0.8 MG Tabs tablet Take 1 tablet by mouth daily at 12 noon.  Discharge Follow Up:  Follow-up Information    Gynecology, [place] Follow up.   Specialty: Obstetrics and Gynecology Why: Next ROB Contact information: [address] [place] [address] [phone]            [facility], NP-C, [place] 05/08/2019, 5:00 PM  Noralyn Pick, [place]

## 2019-05-08 NOTE — Progress Notes (Signed)
DR Richardson Dopp pt of [facility]   Labor Progress Note  Molly Silva is a 39 y.o. female, [phone], IUP at 38.3 weeks who started OB care with Dr Richardson Dopp at [facility] at 10.[redacted] weeks gestation, presenting for spontaneous early labor, pt admits to having precip deliveries and was noted x 1 day in the [place] office to be 1cm dilated, woke with cxt regular every 3-4 and bloody show. Pt worried she would not make it to the hospital in time.HGB at [facility] was 10.8-13.6 @ 28 weeks. Blood type O+. GBS-. Negative genetic testing. All labs unremarkable and negative GC/C/HIV/RPR NR.  Subjective: Pt endorses still feeling cxt and they are sporadic, not making cervical change. Talked with pt about risk of augmenting at 38.3, pt declines and wants what is safe for her baby. Pt still feel she will go into labor if she leave the hospital pt is currently not in labor noted by no cervical change. Pt agreeable to wait until 5pm and if cxt do not stop back up she is agreeable to go home and waiting for labor to spontaneously occur.  Patient Active Problem List   Diagnosis Date Noted  . Labor and delivery indication for care or intervention 05/08/2019  . Labor and delivery, indication for care 09/22/2016  . Active labor at term 12/24/2014  . SVD (spontaneous vaginal delivery) 12/24/2014  . Normal pregnancy 07/02/2013  . Normal delivery 06/13/2011   Objective: BP 105/71   Pulse 93   Temp 98.1 F (36.7 C)   Resp 16   Ht 5\' 6"  (1.676 m)   Wt 71.2 kg   BMI 25.34 kg/m  No intake/output data recorded. No intake/output data recorded. NST: FHR baseline 140 bpm, Variability: moderate, Accelerations:present, Decelerations:  Absent= Cat 1/Reactive CTX:  irritability noted, 1 cxt noted over last 20 mins Uterus gravid, soft non tender, mild  to palpate with contractions.  SVE:  Dilation: 4 Effacement (%): 70 Station: -2 Exam by:: [facility], CNM   Assessment:  Molly Silva is a 39 y.o. female, 30, IUP at  38.3 weeks who started OB care with Dr [id number] at [facility] at 10.[redacted] weeks gestation, presenting for spontaneous early labor, pt admits to having precip deliveries and was noted x 1 day in the [place] office to be 1cm dilated, woke with cxt regular every 3-4 and bloody show. Pt worried she would not make it to the hospital in time.HGB at [facility] was 10.8-13.6 @ 28 weeks. Blood type O+. GBS-. Negative genetic testing. All labs unremarkable and negative GC/C/HIV/RPR NR. Not  Currently Progressing in LABOR cervical change not noted over last four hours 4 CM.  Patient Active Problem List   Diagnosis Date Noted  . Labor and delivery indication for care or intervention 05/08/2019  . Labor and delivery, indication for care 09/22/2016  . Active labor at term 12/24/2014  . SVD (spontaneous vaginal delivery) 12/24/2014  . Normal pregnancy 07/02/2013  . Normal delivery 06/13/2011   NICHD: Category 1  Membranes: Intact, no s/s of infection  Pain management:               IV pain management: x0             Epidural placement: PRN  GBS Negative  Plan: Expectant management @ 38.3 Labor stalled, pt to be discharged at 5pm if labor does not resume.    06/15/2011, NP-C, CNM, MSN 05/08/2019. 3:12 PM

## 2019-05-08 NOTE — H&P (Addendum)
Molly Silva is a 39 y.o. female, [id number], IUP at 38.3 weeks who started OB care with Dr Richardson Dopp at [place] [facility] at 10.[redacted] weeks gestation, presenting for spontaneous early labor, pt admits to having precip deliveries and was noted x 1 day in the [place] office to be 1cm dilated, woke with cxt regular every 3-4 and bloody show. Pt worried she would not make it to the hospital in time.HGB at [facility] was 10.8-13.6 @ 28 weeks. Blood type O+. GBS-. Negative genetic testing. All labs unremarkable and negative GC/C/HIV/RPR NR.  Pt endorse + Fm. Denies vaginal leakage.  Patient Active Problem List   Diagnosis Date Noted  . Labor and delivery indication for care or intervention 05/08/2019  . Labor and delivery, indication for care 09/22/2016  . Active labor at term 12/24/2014  . SVD (spontaneous vaginal delivery) 12/24/2014  . Normal pregnancy 07/02/2013  . Normal delivery 06/13/2011     Medications Prior to Admission  Medication Sig Dispense Refill Last Dose  . Prenatal Vit-Fe Fumarate-FA (MULTIVITAMIN-PRENATAL) 27-0.8 MG TABS tablet Take 1 tablet by mouth daily at 12 noon.   05/07/2019 at Unknown time  . HYDROcodone-acetaminophen (NORCO) 5-325 MG tablet 1-2 tabs po q6 hours prn pain 20 tablet 0 More than a month at Unknown time  . ibuprofen (ADVIL,MOTRIN) 600 MG tablet Take 1 tablet (600 mg total) by mouth every 6 (six) hours as needed. 30 tablet 1 More than a month at Unknown time    Past Medical History:  Diagnosis Date  . Abnormal Pap smear   . Anemia   . Post partum depression      No current facility-administered medications on file prior to encounter.   Current Outpatient Medications on File Prior to Encounter  Medication Sig Dispense Refill  . Prenatal Vit-Fe Fumarate-FA (MULTIVITAMIN-PRENATAL) 27-0.8 MG TABS tablet Take 1 tablet by mouth daily at 12 noon.    Marland Kitchen HYDROcodone-acetaminophen (NORCO) 5-325 MG tablet 1-2 tabs po q6 hours prn pain 20 tablet 0  . ibuprofen (ADVIL,MOTRIN) 600 MG  tablet Take 1 tablet (600 mg total) by mouth every 6 (six) hours as needed. 30 tablet 1     No Known Allergies  OB History    Gravida  9   Para  6   Term  6   Preterm  0   AB  2   Living  6     SAB  1   TAB  1   Ectopic  0   Multiple  0   Live Births  6          Past Medical History:  Diagnosis Date  . Abnormal Pap smear   . Anemia   . Post partum depression    Past Surgical History:  Procedure Laterality Date  . APPENDECTOMY  2009  . ULNAR COLLATERAL LIGAMENT REPAIR Right 03/11/2018   Procedure: RIGHT ULNAR COLLATERAL LIGAMENT REPAIR;  Surgeon: Betha Loa, MD;  Location: [facility];  Service: Orthopedics;  Laterality: Right;  . WISDOM TOOTH EXTRACTION     Family History: family history includes Diabetes in her father; Hypertension in her mother. Social History:  reports that she has never smoked. She has never used smokeless tobacco. She reports that she does not drink alcohol or use drugs.   Prenatal Transfer Tool  Maternal Diabetes: No Genetic Screening: Normal Maternal Ultrasounds/Referrals: Normal Fetal Ultrasounds or other Referrals:  None Maternal Substance Abuse:  No Significant Maternal Medications:  None Significant Maternal Lab Results: Group B  Strep negative  ROS:  Review of Systems  Constitutional: Negative.   HENT: Negative.   Eyes: Negative.   Respiratory: Negative.   Cardiovascular: Negative.   Gastrointestinal: Positive for abdominal pain.  Genitourinary: Negative.   Musculoskeletal: Negative.   Skin: Negative.   Neurological: Negative.   Endo/Heme/Allergies: Negative.   Psychiatric/Behavioral: Negative.      Physical Exam: BP 107/68   Pulse 83   Temp 98.3 F (36.8 C) (Oral)   Resp 16   Ht 5\' 6"  (1.676 m)   Wt 71.2 kg   BMI 25.34 kg/m   Physical Exam  Constitutional: She is oriented to person, place, and time and well-developed, well-nourished, and in no distress.  HENT:  Head: Normocephalic and  atraumatic.  Eyes: Pupils are equal, round, and reactive to light. Conjunctivae are normal.  Cardiovascular: Normal rate and regular rhythm.  Pulmonary/Chest: Effort normal and breath sounds normal.  Abdominal: Soft. Bowel sounds are normal.  Genitourinary:    Genitourinary Comments: Pelvis adequate, proven to 8.5 lbs, uterus soft non-tender, uterus gravida equal to dates.    Musculoskeletal:        General: Normal range of motion.     Cervical back: Normal range of motion and neck supple.  Neurological: She is alert and oriented to person, place, and time. Gait normal.  Skin: Skin is warm and dry.  Psychiatric: Affect normal.  Nursing note and vitals reviewed.    NST: FHR baseline 145 bpm, Variability: moderate, Accelerations:present, Decelerations:  Absent= Cat 1/Reactive UC:   irregular, every 3-4 minutes SVE:   Dilation: 3 Effacement (%): 50 Station: -3 Exam by:: J Amen Dargis CNM, vertex verified by fetal sutures.  Leopold's: Position vertex, EFW 7 via leopold's.   Labs: Results for orders placed or performed during the hospital encounter of 05/08/19 (from the past 24 hour(s))  Respiratory Panel by RT PCR (Flu A&B, Covid) - Nasopharyngeal Swab     Status: None   Collection Time: 05/08/19  2:10 AM   Specimen: Nasopharyngeal Swab  Result Value Ref Range   SARS Coronavirus 2 by RT PCR NEGATIVE NEGATIVE   Influenza A by PCR NEGATIVE NEGATIVE   Influenza B by PCR NEGATIVE NEGATIVE  CBC     Status: Abnormal   Collection Time: 05/08/19  2:30 AM  Result Value Ref Range   WBC 11.1 (H) 4.0 - 10.5 K/uL   RBC 3.75 (L) 3.87 - 5.11 MIL/uL   Hemoglobin 10.0 (L) 12.0 - 15.0 g/dL   HCT 32.5 (L) 36.0 - 46.0 %   MCV 86.7 80.0 - 100.0 fL   MCH 26.7 26.0 - 34.0 pg   MCHC 30.8 30.0 - 36.0 g/dL   RDW 14.1 11.5 - 15.5 %   Platelets 200 150 - 400 K/uL   nRBC 0.0 0.0 - 0.2 %  Type and screen [place]     Status: None   Collection Time: 05/08/19  2:30 AM  Result Value Ref  Range   ABO/RH(D) O POS    Antibody Screen NEG    Sample Expiration      05/11/2019,2359 Performed at [facility], [address], [place], [place] [address]   ABO/Rh     Status: None (Preliminary result)   Collection Time: 05/08/19  2:30 AM  Result Value Ref Range   ABO/RH(D)      O POS Performed at [facility] [address], [place], [place] [address]     Imaging:  No results  found.  MAU Course: Orders Placed This Encounter  Procedures  . Respiratory Panel by RT PCR (Flu A&B, Covid) - Nasopharyngeal Swab  . CBC  . RPR  . Diet clear liquid Room service appropriate? Yes; Fluid consistency: Thin  . Vitals signs per unit policy  . Notify Physician  . Fetal monitoring per unit policy  . Activity as tolerated  . Cervical Exam  . Measure blood pressure post delivery every 15 min x 1 hour then every 30 min x 1 hour  . Fundal check post delivery every 15 min x 1 hour then every 30 min x 1 hour  . If Rapid HIV test positive or known HIV positive: initiate AZT orders  . May in and out cath x 2 for inability to void  . Insert foley catheter  . Discontinue foley prior to vaginal delivery  . Initiate Carrier Fluid Protocol  . Initiate Oral Care Protocol  . Order Rapid HIV per protocol if no results on chart  . Full code  . Type and screen [facility]  . ABO/Rh  . Insert and maintain IV Line  . Admit to Inpatient (patient's expected length of stay will be greater than 2 midnights or inpatient only procedure)   Meds ordered this encounter  Medications  . lactated ringers infusion  . oxytocin (PITOCIN) IV BOLUS FROM BAG  . oxytocin (PITOCIN) IV infusion 40 units in NS 1000 mL - Premix  . lactated ringers infusion 500-1,000 mL  . acetaminophen (TYLENOL) tablet 650 mg  . oxyCODONE-acetaminophen (PERCOCET/ROXICET) 5-325 MG per tablet 1 tablet  . oxyCODONE-acetaminophen (PERCOCET/ROXICET) 5-325 MG per tablet 2 tablet  . ondansetron (ZOFRAN)  injection 4 mg  . sodium citrate-citric acid (ORACIT) solution 30 mL  . lidocaine (PF) (XYLOCAINE) 1 % injection 30 mL    Assessment/Plan: Molly Silva is a 40 y.o. female, [id number], IUP at 38.3 weeks who started OB care with Dr Richardson Dopp at [facility] at 10.[redacted] weeks gestation, presenting for spontaneous early labor, pt admits to having precip deliveries and was noted x 1 day in the [place] office to be 1cm dilated, woke with cxt regular every 3-4 and bloody show. Pt worried she would not make it to the hospital in time.HGB at [facility] was 10.8-13.6 @ 28 weeks. Blood type O+. GBS-. Negative genetic testing. All labs unremarkable and negative GC/C/HIV/RPR NR.  Pt endorse + Fm. Denies vaginal leakage.  FWB: Cat 1 Fetal Tracing.   Plan: Admit to [facility] Suite per consult with Dr Mora Appl Routine [facility] orders Pain med/epidural prn Natural delivery expected.  Anticipate labor progression   Dale Minkler NP-C, CNM, MSN 05/08/2019, 3:46 AM

## 2019-05-08 NOTE — Progress Notes (Signed)
Labor Progress Note  Molly Silva is a 39 y.o. female, [id number], IUP at 38.3 weeks who started OB care with Dr Richardson Dopp at [place] [facility] at 10.[redacted] weeks gestation, presenting for spontaneous early labor, pt admits to having precip deliveries and was noted x 1 day in the [place] office to be 1cm dilated, woke with cxt regular every 3-4 and bloody show. Pt worried she would not make it to the hospital in time.HGB at [facility] was 10.8-13.6 @ 28 weeks. Blood type O+. GBS-. Negative genetic testing. All labs unremarkable and negative GC/C/HIV/RPR NR.  Subjective: Pt endorses resting well, and still feeling cxt, but they have spaced out a lot. Pt stated she is able to sleep through them but every now and then has one large cxt that wakes her up.  Patient Active Problem List   Diagnosis Date Noted  . Labor and delivery indication for care or intervention 05/08/2019  . Labor and delivery, indication for care 09/22/2016  . Active labor at term 12/24/2014  . SVD (spontaneous vaginal delivery) 12/24/2014  . Normal pregnancy 07/02/2013  . Normal delivery 06/13/2011   Objective: BP 107/68   Pulse 83   Temp 98.3 F (36.8 C) (Oral)   Resp 16   Ht 5\' 6"  (1.676 m)   Wt 71.2 kg   BMI 25.34 kg/m  No intake/output data recorded. No intake/output data recorded. NST: FHR baseline 125 bpm, Variability: moderate, Accelerations:present, Decelerations:  Absent= Cat 1/Reactive CTX:  irregular, every 3-6 minutes Uterus gravid, soft non tender, moderate to palpate with contractions.  SVE:  Dilation: 3 Effacement (%): 50 Station: -3 Exam by:: J Phylicia Mcgaugh CNM Pitocin at (0) mUn/min  Assessment:  Molly Silva is a 39 y.o. female, 30, IUP at 38.3 weeks who started OB care with Dr [id number] at [place] [facility] at 10.[redacted] weeks gestation, presenting for spontaneous early labor, pt admits to having precip deliveries and was noted x 1 day in the [place] office to be 1cm dilated, woke with cxt regular every 3-4 and bloody show. Pt  worried she would not make it to the hospital in time.HGB at [facility] was 10.8-13.6 @ 28 weeks. Blood type O+. GBS-. Negative genetic testing. All labs unremarkable and negative GC/C/HIV/RPR NR. Not progressing in early labor, cxt have stalled out.  Patient Active Problem List   Diagnosis Date Noted  . Labor and delivery indication for care or intervention 05/08/2019  . Labor and delivery, indication for care 09/22/2016  . Active labor at term 12/24/2014  . SVD (spontaneous vaginal delivery) 12/24/2014  . Normal pregnancy 07/02/2013  . Normal delivery 06/13/2011   NICHD: Category 1  Membranes: Intact, no s/s of infection  Pain management:               IV pain management: x0             Epidural placement: PRN  GBS Negative  Plan: Continue labor plan Continuous/intermittent monitoring Rest Ambulate Frequent position changes to facilitate fetal rotation and descent. Will reassess with cervical exam at 1000 or earlier if necessary Will discuss plan of care with cxt stalled and precip delivery history with DR 06/15/2011 in report at 0700 Anticipate labor progression and vaginal delivery.   Sallye Ober, NP-C, CNM, MSN 05/08/2019. 6:56 AM

## 2019-05-08 NOTE — Progress Notes (Signed)
DR Richardson Dopp pt of [facility]   Labor Progress Note  Molly Silva is a 39 y.o. female, [id number], IUP at 38.3 weeks who started OB care with Dr Richardson Dopp at [place] [facility] at 10.[redacted] weeks gestation, presenting for spontaneous early labor, pt admits to having precip deliveries and was noted x 1 day in the [place] office to be 1cm dilated, woke with cxt regular every 3-4 and bloody show. Pt worried she would not make it to the hospital in time.HGB at [facility] was 10.8-13.6 @ 28 weeks. Blood type O+. GBS-. Negative genetic testing. All labs unremarkable and negative GC/C/HIV/RPR NR.  Subjective: Pt endorses still feeling cxt, on the birthing ball and walking around. Still having scant bloody show. Pt feels if she were to go home the she would go into labor immediately, ppt progress slightly in early labor will continue to monitor natural progressions.  Patient Active Problem List   Diagnosis Date Noted  . Labor and delivery indication for care or intervention 05/08/2019  . Labor and delivery, indication for care 09/22/2016  . Active labor at term 12/24/2014  . SVD (spontaneous vaginal delivery) 12/24/2014  . Normal pregnancy 07/02/2013  . Normal delivery 06/13/2011   Objective: BP 108/62   Pulse 85   Temp 98.4 F (36.9 C) (Oral)   Resp 16   Ht 5\' 6"  (1.676 m)   Wt 71.2 kg   BMI 25.34 kg/m  No intake/output data recorded. No intake/output data recorded. NST: FHR baseline 155 bpm, Variability: moderate, Accelerations:present, Decelerations:  Absent= Cat 1/Reactive CTX:  irregular, every 3-6 minutes Uterus gravid, soft non tender, moderate to palpate with contractions.  SVE:  Dilation: 4 Effacement (%): 60 Station: -2 Exam by:: [email or web address], CNM Pitocin at (0) mUn/min  Assessment:  Molly Silva is a 39 y.o. female, 30, IUP at 38.3 weeks who started OB care with Dr [id number] at [place] [facility] at 10.[redacted] weeks gestation, presenting for spontaneous early labor, pt admits to having precip  deliveries and was noted x 1 day in the [place] office to be 1cm dilated, woke with cxt regular every 3-4 and bloody show. Pt worried she would not make it to the hospital in time.HGB at [facility] was 10.8-13.6 @ 28 weeks. Blood type O+. GBS-. Negative genetic testing. All labs unremarkable and negative GC/C/HIV/RPR NR. Progressing in natural LABOR TO 4 CM.  Patient Active Problem List   Diagnosis Date Noted  . Labor and delivery indication for care or intervention 05/08/2019  . Labor and delivery, indication for care 09/22/2016  . Active labor at term 12/24/2014  . SVD (spontaneous vaginal delivery) 12/24/2014  . Normal pregnancy 07/02/2013  . Normal delivery 06/13/2011   NICHD: Category 1  Membranes: Intact, no s/s of infection  Pain management:               IV pain management: x0             Epidural placement: PRN  GBS Negative  Plan: Continue labor plan Continuous/intermittent monitoring Rest Ambulate Frequent position changes to facilitate fetal rotation and descent. Will reassess with cervical exam at 1400 or earlier if necessary Will discuss plan of care with cxt stalled and precip delivery history with DR 06/15/2011 in report at 0700 Anticipate labor progression and vaginal delivery.   Sallye Ober, NP-C, CNM, MSN 05/08/2019. 9:49 AM

## 2019-05-08 NOTE — MAU Note (Signed)
Pt reports to MAU c/o ctx every . Pt has bloody mucous that has been coming out. No LOF. Pt reports last FM was at 2300. Pain is a 2/10. 1cm yesterday.

## 2019-05-08 NOTE — H&P (Signed)
Molly Silva is a 39 y.o. female, [id number], IUP at 38.3 weeks who started OB care with Dr Landry Mellow at [place] [facility] at 10.[redacted] weeks gestation, presenting for spontaneous early labor, pt admits to having precip deliveries and was noted x 1 day in the [place] office to be 1cm dilated, woke with cxt regular every 3-4 and bloody show. Pt worried she would not make it to the hospital in time.HGB at [facility] was 10.8-13.6 @ 28 weeks. Blood type O+. GBS-. Negative genetic testing. All labs unremarkable and negative GC/C/HIV/RPR NR.  Pt endorse + Fm. Denies vaginal leakage.  Patient Active Problem List   Diagnosis Date Noted  . False labor 05/08/2019  . Normal labor and delivery 05/08/2019  . Labor and delivery, indication for care 09/22/2016  . Active labor at term 12/24/2014  . SVD (spontaneous vaginal delivery) 12/24/2014  . Normal pregnancy 07/02/2013  . Normal delivery 06/13/2011     Medications Prior to Admission  Medication Sig Dispense Refill Last Dose  . Prenatal Vit-Fe Fumarate-FA (MULTIVITAMIN-PRENATAL) 27-0.8 MG TABS tablet Take 1 tablet by mouth daily at 12 noon.       Past Medical History:  Diagnosis Date  . Abnormal Pap smear   . Anemia   . Post partum depression      No current facility-administered medications on file prior to encounter.   Current Outpatient Medications on File Prior to Encounter  Medication Sig Dispense Refill  . Prenatal Vit-Fe Fumarate-FA (MULTIVITAMIN-PRENATAL) 27-0.8 MG TABS tablet Take 1 tablet by mouth daily at 12 noon.       No Known Allergies  OB History    Gravida  9   Para  6   Term  6   Preterm  0   AB  2   Living  6     SAB  1   TAB  1   Ectopic  0   Multiple  0   Live Births  6          Past Medical History:  Diagnosis Date  . Abnormal Pap smear   . Anemia   . Post partum depression    Past Surgical History:  Procedure Laterality Date  . APPENDECTOMY  2009  . ULNAR COLLATERAL LIGAMENT REPAIR Right 03/11/2018   Procedure: RIGHT ULNAR COLLATERAL LIGAMENT REPAIR;  Surgeon: Leanora Cover, MD;  Location: [facility];  Service: Orthopedics;  Laterality: Right;  . WISDOM TOOTH EXTRACTION     Family History: family history includes Diabetes in her father; Hypertension in her mother. Social History:  reports that she has never smoked. She has never used smokeless tobacco. She reports that she does not drink alcohol or use drugs.   Prenatal Transfer Tool  Maternal Diabetes: No Genetic Screening: Normal Maternal Ultrasounds/Referrals: Normal Fetal Ultrasounds or other Referrals:  None Maternal Substance Abuse:  No Significant Maternal Medications:  None Significant Maternal Lab Results: Group B Strep negative  ROS:  Review of Systems  Constitutional: Negative.   HENT: Negative.   Eyes: Negative.   Respiratory: Negative.   Cardiovascular: Negative.   Gastrointestinal: Positive for abdominal pain.  Genitourinary: Negative.   Musculoskeletal: Negative.   Skin: Negative.   Neurological: Negative.   Endo/Heme/Allergies: Negative.   Psychiatric/Behavioral: Negative.      Physical Exam: BP (!) 135/91   Pulse (!) 107   Resp (!) (P) 22   Ht (P) 5\' 6"  (1.676 m)   Wt (P) 71.2 kg   BMI (P) 25.34 kg/m  Physical Exam  Constitutional: She is oriented to person, place, and time and well-developed, well-nourished, and in no distress.  HENT:  Head: Normocephalic and atraumatic.  Eyes: Pupils are equal, round, and reactive to light. Conjunctivae are normal.  Cardiovascular: Normal rate and regular rhythm.  Pulmonary/Chest: Effort normal and breath sounds normal.  Abdominal: Soft. Bowel sounds are normal.  Genitourinary:    Genitourinary Comments: Pelvis adequate, proven to 8.5 lbs, uterus soft non-tender, uterus gravida equal to dates.    Musculoskeletal:        General: Normal range of motion.     Cervical back: Normal range of motion and neck supple.  Neurological: She is alert and  oriented to person, place, and time. Gait normal.  Skin: Skin is warm and dry.  Psychiatric: Affect normal.  Nursing note and vitals reviewed.    NST: FHR baseline 145 bpm, Variability: moderate, Accelerations:present, Decelerations:  Absent= Cat 1/Reactive UC:   irregular, every 3-4 minutes SVE:   Dilation: (P) 6 Effacement (%): 80 Station: -2 Exam by:: Lauren Cox, RN, vertex verified by fetal sutures.  Leopold's: Position vertex, EFW 7 via leopold's.   Labs: Results for orders placed or performed during the hospital encounter of 05/08/19 (from the past 24 hour(s))  CBC     Status: Abnormal   Collection Time: 05/08/19 11:34 PM  Result Value Ref Range   WBC 15.3 (H) 4.0 - 10.5 K/uL   RBC 3.64 (L) 3.87 - 5.11 MIL/uL   Hemoglobin 10.0 (L) 12.0 - 15.0 g/dL   HCT 18.2 (L) 99.3 - 71.6 %   MCV 86.8 80.0 - 100.0 fL   MCH 27.5 26.0 - 34.0 pg   MCHC 31.6 30.0 - 36.0 g/dL   RDW 96.7 89.3 - 81.0 %   Platelets 194 150 - 400 K/uL   nRBC 0.0 0.0 - 0.2 %    Imaging:  No results found.  MAU Course: Orders Placed This Encounter  Procedures  . CBC  . RPR  . Diet clear liquid Room service appropriate? Yes; Fluid consistency: Thin  . Vitals signs per unit policy  . Notify Physician  . Fetal monitoring per unit policy  . Activity as tolerated  . Cervical Exam  . Measure blood pressure post delivery every 15 min x 1 hour then every 30 min x 1 hour  . Fundal check post delivery every 15 min x 1 hour then every 30 min x 1 hour  . If Rapid HIV test positive or known HIV positive: initiate AZT orders  . May in and out cath x 2 for inability to void  . Insert foley catheter  . Discontinue foley prior to vaginal delivery  . Initiate Carrier Fluid Protocol  . Initiate Oral Care Protocol  . Order Rapid HIV per protocol if no results on chart  . Full code  . Type and screen [facility]  . Insert and maintain IV Line  . Admit to Inpatient (patient's expected length of stay  will be greater than 2 midnights or inpatient only procedure)   Meds ordered this encounter  Medications  . lactated ringers infusion  . oxytocin (PITOCIN) IV BOLUS FROM BAG  . oxytocin (PITOCIN) IV infusion 40 units in NS 1000 mL - Premix  . lactated ringers infusion 500-1,000 mL  . acetaminophen (TYLENOL) tablet 650 mg  . oxyCODONE-acetaminophen (PERCOCET/ROXICET) 5-325 MG per tablet 1 tablet  . oxyCODONE-acetaminophen (PERCOCET/ROXICET) 5-325 MG per tablet 2 tablet  . sodium phosphate (FLEET) 7-19  GM/118ML enema 1 enema  . ondansetron (ZOFRAN) injection 4 mg  . sodium citrate-citric acid (ORACIT) solution 30 mL  . lidocaine (PF) (XYLOCAINE) 1 % injection 30 mL  . oxytocin 40 units in NS 1000 mL 40 units/1000 mL infusion    Jones, Auriel   : cabinet override    Assessment/Plan: Molly Silva is a 39 y.o. female, [id number], IUP at 38.3 weeks who started OB care with Dr Richardson Dopp at [place] [facility] at 10.[redacted] weeks gestation, presenting for spontaneous early labor, pt admits to having precip deliveries and was noted x 1 day in the [place] office to be 1cm dilated, woke with cxt regular every 3-4 and bloody show. Pt worried she would not make it to the hospital in time.HGB at [facility] was 10.8-13.6 @ 28 weeks. Blood type O+. GBS-. Negative genetic testing. All labs unremarkable and negative GC/C/HIV/RPR NR.  Pt endorse + Fm. Denies vaginal leakage.  FWB: Cat 1 Fetal Tracing.   Plan: Admit to [facility] Suite per consult with Dr Mora Appl Routine [facility] orders Pain med/epidural prn Natural delivery expected.  Anticipate labor progression   Dale Bruno NP-C, CNM, MSN 05/08/2019, 3:46 AM

## 2019-05-09 ENCOUNTER — Encounter (HOSPITAL_COMMUNITY): Payer: Self-pay | Admitting: Obstetrics & Gynecology

## 2019-05-09 DIAGNOSIS — Z3A38 38 weeks gestation of pregnancy: Secondary | ICD-10-CM | POA: Diagnosis not present

## 2019-05-09 LAB — CBC
HCT: 30.3 % — ABNORMAL LOW (ref 36.0–46.0)
Hemoglobin: 9.4 g/dL — ABNORMAL LOW (ref 12.0–15.0)
MCH: 27.3 pg (ref 26.0–34.0)
MCHC: 31 g/dL (ref 30.0–36.0)
MCV: 88.1 fL (ref 80.0–100.0)
Platelets: 184 10*3/uL (ref 150–400)
RBC: 3.44 MIL/uL — ABNORMAL LOW (ref 3.87–5.11)
RDW: 14.3 % (ref 11.5–15.5)
WBC: 19.1 10*3/uL — ABNORMAL HIGH (ref 4.0–10.5)
nRBC: 0 % (ref 0.0–0.2)

## 2019-05-09 MED ORDER — BENZOCAINE-MENTHOL 20-0.5 % EX AERO: 1.0000 "application " | INHALATION_SPRAY | CUTANEOUS | Status: DC | PRN

## 2019-05-09 MED ORDER — IBUPROFEN 600 MG PO TABS
600.0000 mg | ORAL_TABLET | Freq: Four times a day (QID) | ORAL | Status: DC
  Filled 2019-05-09: qty 1

## 2019-05-09 MED ORDER — ZOLPIDEM TARTRATE 5 MG PO TABS: 5.0000 mg | ORAL_TABLET | Freq: Every evening | ORAL | Status: DC | PRN

## 2019-05-09 MED ORDER — WITCH HAZEL-GLYCERIN EX PADS: 1.0000 "application " | MEDICATED_PAD | CUTANEOUS | Status: DC | PRN

## 2019-05-09 MED ORDER — SIMETHICONE 80 MG PO CHEW: 80.0000 mg | CHEWABLE_TABLET | ORAL | Status: DC | PRN

## 2019-05-09 MED ORDER — TETANUS-DIPHTH-ACELL PERTUSSIS 5-2.5-18.5 LF-MCG/0.5 IM SUSP: 0.5000 mL | Freq: Once | INTRAMUSCULAR | Status: DC

## 2019-05-09 MED ORDER — COCONUT OIL OIL: 1.0000 "application " | TOPICAL_OIL | Status: DC | PRN

## 2019-05-09 MED ORDER — PRENATAL MULTIVITAMIN CH
1.0000 | ORAL_TABLET | Freq: Every day | ORAL | Status: DC
  Administered 2019-05-09: 12:00:00 1 via ORAL
  Filled 2019-05-09: qty 1

## 2019-05-09 MED ORDER — DIBUCAINE (PERIANAL) 1 % EX OINT: 1.0000 "application " | TOPICAL_OINTMENT | CUTANEOUS | Status: DC | PRN

## 2019-05-09 MED ORDER — ONDANSETRON HCL 4 MG/2ML IJ SOLN: 4.0000 mg | INTRAMUSCULAR | Status: DC | PRN

## 2019-05-09 MED ORDER — ACETAMINOPHEN 325 MG PO TABS: 650.0000 mg | ORAL_TABLET | ORAL | Status: DC | PRN

## 2019-05-09 MED ORDER — SENNOSIDES-DOCUSATE SODIUM 8.6-50 MG PO TABS
2.0000 | ORAL_TABLET | ORAL | Status: DC
  Filled 2019-05-09: qty 2

## 2019-05-09 MED ORDER — DIPHENHYDRAMINE HCL 25 MG PO CAPS: 25.0000 mg | ORAL_CAPSULE | Freq: Four times a day (QID) | ORAL | Status: DC | PRN

## 2019-05-09 MED ORDER — ONDANSETRON HCL 4 MG PO TABS: 4.0000 mg | ORAL_TABLET | ORAL | Status: DC | PRN

## 2019-05-09 NOTE — Progress Notes (Signed)
Post Partum Day 1 Subjective: no complaints, up ad lib, voiding, tolerating PO and + flatus  Objective: Blood pressure 107/69, pulse 89, temperature 98.1 F (36.7 C), temperature source Oral, resp. rate 14, height 5\' 6"  (1.676 m), weight 71.2 kg, SpO2 99 %, unknown if currently breastfeeding.  Physical Exam:  General: alert, cooperative and no distress Lochia: appropriate Uterine Fundus: firm Incision: NA DVT Evaluation: No evidence of DVT seen on physical exam.  Recent Labs    05/08/19 2334 05/09/19 0438  HGB 10.0* 9.4*  HCT 31.6* 30.3*    Assessment/Plan: Plan for discharge tomorrow  Routine postpartum care    LOS: 1 day   05/11/19 05/09/2019, 1:28 PM

## 2019-05-09 NOTE — Progress Notes (Signed)
CSW received consult for hx of Post Partum Depression.  CSW met with MOB to offer support and complete assessment.    CSW congratulated MOB on the birth of infant. CSW advised MOB of CSW's role and the reason for CSW coming to visit with her. MOB reports that she has PPD in 2016 after the birth of her child. MOB reports that she had another child in 2018 and didn't deal with PPD. MOB expressed that she had PPD for almost a a year and was placed on medications and in therapy. MOB reported that therapy was helpful "but Im not really a medication person"  CSW understanding of this and assessed MOB for PPD at this time. MOB denies all symptoms that could show PPD and reports that she isn't feeling SI or HI. MOB also reported that she isn't involved in a DV relationship.   MOB expressed that she has support from her spouse and has all needed items to care for infant with no other needs at this time.   CSW provided education regarding the baby blues period vs. perinatal mood disorders, discussed treatment and gave resources for mental health follow up if concerns arise.  CSW recommends self-evaluation during the postpartum time period using the New Mom Checklist from Postpartum Progress and encouraged MOB to contact a medical professional if symptoms are noted at any time.   CSW provided review of Sudden Infant Death Syndrome (SIDS) precautions.     CSW identifies no further need for intervention and no barriers to discharge at this time.    Molly Silva, MSW, LCSW [facility] [place] [phone]

## 2019-05-09 NOTE — Lactation Note (Signed)
This note was copied from a baby's chart. Lactation Consultation Note  Patient Name: Girl Josey Dettmann [id number] Date: 05/09/2019 Reason for consult: Initial assessment   P7, Baby 9 hours old.  Mother states she bf all her children for a yr and last child for 2 yrs. Mother will call if help is needed. Mother states she knows how to hand express and denies questions and concerns. Feed on demand with cues.  Goal 8-12+ times per day after first 24 hrs.  Place baby STS if not cueing.  Mom made aware of O/P services, breastfeeding support groups, community resources, and our phone # for post-discharge questions.        Maternal Data Has patient been taught Hand Expression?: Yes Does the patient have breastfeeding experience prior to this delivery?: Yes  Feeding Feeding Type: Breast Fed  LATCH Score                   Interventions Interventions: Breast feeding basics reviewed  Lactation Tools Discussed/Used     Consult Status Consult Status: Complete    Hardie Pulley 05/09/2019, 9:03 AM

## 2019-05-10 MED ORDER — IBUPROFEN 600 MG PO TABS: 600.0000 mg | ORAL_TABLET | Freq: Four times a day (QID) | ORAL | 0 refills | Status: DC | PRN

## 2019-05-10 NOTE — Discharge Summary (Signed)
OB Discharge Summary     Patient Name: Molly Silva DOB: 03/03/1980 MRN: [id number]  Date of admission: 05/08/2019 Delivering MD: Dale Yulee   Date of discharge: 05/10/2019  Admitting diagnosis: Normal labor and delivery [O80] Intrauterine pregnancy: [redacted]w[redacted]d     Secondary diagnosis:  Active Problems:   Normal labor and delivery  Additional problems: None    Discharge diagnosis: Term Pregnancy Delivered                                                                                                Post partum procedures:None  Augmentation: None  Complications: None  Hospital course:  Onset of Labor With Vaginal Delivery     39 y.o. yo [id number] at [redacted]w[redacted]d was admitted in Active Labor on 05/08/2019. Patient had an uncomplicated labor course as follows:  Membrane Rupture Time/Date: 11:30 PM ,05/08/2019   Intrapartum Procedures: Episiotomy: None [1]                                         Lacerations:  None [1]  Patient had a delivery of a Viable infant. 05/08/2019  Information for the patient's newborn:  [place], Facchini Girl Britiany [[id number]]       Pateint had an uncomplicated postpartum course.  She is ambulating, tolerating a regular diet, passing flatus, and urinating well. Patient is discharged home in stable condition on 05/10/19.   Physical exam  Vitals:   05/09/19 1000 05/09/19 1400 05/09/19 2151 05/10/19 0605  BP: 107/69 102/72 120/83 118/88  Pulse: 89 (!) 101 100 79  Resp: 14 16 18 18   Temp: 98.1 F (36.7 C) 98.6 F (37 C) 98.4 F (36.9 C) 98.1 F (36.7 C)  TempSrc: Oral Oral Oral Oral  SpO2:    100%  Weight:      Height:       General: alert, cooperative and no distress Lochia: appropriate Uterine Fundus: firm Incision: N/A DVT Evaluation: No evidence of DVT seen on physical exam. Labs: Lab Results  Component Value Date   WBC 19.1 (H) 05/09/2019   HGB 9.4 (L) 05/09/2019   HCT 30.3 (L) 05/09/2019   MCV 88.1 05/09/2019   PLT 184 05/09/2019   No flowsheet  data found.  Discharge instruction: per After Visit Summary and "Baby and Me Booklet".  After visit meds:  Allergies as of 05/10/2019   No Known Allergies     Medication List    TAKE these medications   ibuprofen 600 MG tablet Commonly known as: ADVIL Take 1 tablet (600 mg total) by mouth every 6 (six) hours as needed.   multivitamin-prenatal 27-0.8 MG Tabs tablet Take 1 tablet by mouth daily at 12 noon.       Diet: routine diet  Activity: Advance as tolerated. Pelvic rest for 6 weeks.   Outpatient follow up:2 weeks Follow up Appt:No future appointments. Follow up Visit:No follow-ups on file.  Postpartum contraception: Vasectomy  Newborn Data: Live born female  Birth Weight: 7 lb 5.8 oz (3340 g)  APGAR: 72, 9  Newborn Delivery   Birth date/time: 05/08/2019 23:41:00 Delivery type: Vaginal, Spontaneous      Baby Feeding: Breast Disposition:home with mother   05/10/2019 Christophe Louis, MD

## 2019-05-10 NOTE — Lactation Note (Signed)
This note was copied from a baby's chart. Lactation Consultation Note:  LC follow up to see mother and discuss discharge teaching. Mother reports that her milk is coming in and she declines having any nipple pain .   Mother reports that she had Mastitis with first child and remembers all S/S.  Discussed treatment and prevent of engorgement.   Mother receptive to all teaching.   She was given a harmony hand pump with a # 24 flange.  Mother reports that she has an electric pump at home. She reports that she exclusively breastfeeds.   Mother to continue to cue base feed infant and feed at least 8-12 times or more in 24 hours and advised to allow for cluster feeding infant as needed.   Mother to continue to due STS. Mother is aware of available LC services at [facility], BFSG'S, OP Dept, and phone # for questions or concerns about breastfeeding.  Mother receptive to all teaching and plan of care.    Patient Name: Molly Silva [id number] Date: 05/10/2019     Maternal Data    Feeding Feeding Type: Breast Fed  LATCH Score Latch: Grasps breast easily, tongue down, lips flanged, rhythmical sucking.  Audible Swallowing: Spontaneous and intermittent  Type of Nipple: Everted at rest and after stimulation  Comfort (Breast/Nipple): Soft / non-tender  Hold (Positioning): No assistance needed to correctly position infant at breast.  LATCH Score: 10  Interventions Interventions: Skin to skin  Lactation Tools Discussed/Used     Consult Status      Michel Bickers 05/10/2019, 9:41 AM

## 2019-06-21 DIAGNOSIS — F418 Other specified anxiety disorders: Secondary | ICD-10-CM | POA: Diagnosis not present

## 2019-06-21 DIAGNOSIS — Z8742 Personal history of other diseases of the female genital tract: Secondary | ICD-10-CM | POA: Diagnosis not present

## 2019-11-06 ENCOUNTER — Encounter (HOSPITAL_COMMUNITY): Payer: Self-pay

## 2019-11-06 ENCOUNTER — Ambulatory Visit (INDEPENDENT_AMBULATORY_CARE_PROVIDER_SITE_OTHER): Payer: 59

## 2019-11-06 ENCOUNTER — Other Ambulatory Visit: Payer: Self-pay

## 2019-11-06 ENCOUNTER — Ambulatory Visit (HOSPITAL_COMMUNITY)
Admission: EM | Admit: 2019-11-06 | Discharge: 2019-11-06 | Disposition: A | Discharge status: Home / Self Care | Payer: 59 | Attending: Emergency Medicine | Admitting: Emergency Medicine

## 2019-11-06 DIAGNOSIS — R0602 Shortness of breath: Secondary | ICD-10-CM

## 2019-11-06 DIAGNOSIS — M542 Cervicalgia: Secondary | ICD-10-CM

## 2019-11-06 DIAGNOSIS — R0789 Other chest pain: Secondary | ICD-10-CM | POA: Diagnosis not present

## 2019-11-06 DIAGNOSIS — R079 Chest pain, unspecified: Secondary | ICD-10-CM

## 2019-11-06 DIAGNOSIS — R059 Cough, unspecified: Secondary | ICD-10-CM | POA: Diagnosis not present

## 2019-11-06 DIAGNOSIS — M25522 Pain in left elbow: Secondary | ICD-10-CM

## 2019-11-06 NOTE — ED Provider Notes (Signed)
[facility]    CSN: [id number] Arrival date & time: 11/06/19  1041      History   Chief Complaint No chief complaint on file.   HPI Molly Silva is a 39 y.o. female.   Erskine Speed Borge presents with complaints of chest pain  And left arm soreness. Neck is painful and stiff. Low back pain. Chest pain started last night, arm pain started today. Took alka selzer didn't help. No shortness of breath . No pain with inspiration. Constant pain, and sometimes sharp and stabbing. Denies any previous similar. Diarrhea today. No other nausea or vomiting. Also with diarrhea three nights ago. No cough. No congestion or sore throat. No asthma. Doesn't smoke. No leg pain or swelling. Hasn't eaten today, so uncertain if eating worsens. Yesterday ate grilled cheese and chicken noodle soup before onset of symptoms. No fevers. No increased stress/ anxiety. Breastfeeding. No breast pain redness swelling or warmth.  No cardiac history. No known injury or overuse.     ROS per HPI, negative if not otherwise mentioned.      Past Medical History:  Diagnosis Date  . Abnormal Pap smear   . Anemia   . Post partum depression     Patient Active Problem List   Diagnosis Date Noted  . False labor 05/08/2019  . Normal labor and delivery 05/08/2019  . Labor and delivery, indication for care 09/22/2016  . Active labor at term 12/24/2014  . SVD (spontaneous vaginal delivery) 12/24/2014  . Normal pregnancy 07/02/2013  . Normal delivery 06/13/2011    Past Surgical History:  Procedure Laterality Date  . APPENDECTOMY  2009  . ULNAR COLLATERAL LIGAMENT REPAIR Right 03/11/2018   Procedure: RIGHT ULNAR COLLATERAL LIGAMENT REPAIR;  Surgeon: Betha Loa, MD;  Location: [facility];  Service: Orthopedics;  Laterality: Right;  . WISDOM TOOTH EXTRACTION      OB History    Gravida  9   Para  7   Term  7   Preterm  0   AB  2   Living  7     SAB  1   TAB  1   Ectopic  0    Multiple  0   Live Births  7            Home Medications    Prior to Admission medications   Medication Sig Start Date End Date Taking? Authorizing Provider  aspirin-sod bicarb-citric acid (ALKA-SELTZER) 325 MG TBEF tablet Take 325 mg by mouth every 6 (six) hours as needed.   Yes [provider]  ibuprofen (ADVIL) 600 MG tablet Take 1 tablet (600 mg total) by mouth every 6 (six) hours as needed. 05/10/19  Yes Gerald Leitz, MD  Prenatal Vit-Fe Fumarate-FA (PRENATAL MULTIVITAMIN) TABS tablet Take 1 tablet by mouth daily at 12 noon.   Yes [provider]    Family History Family History  Problem Relation Age of Onset  . Hypertension Mother   . Diabetes Father     Social History Social History   Tobacco Use  . Smoking status: Never Smoker  . Smokeless tobacco: Never Used  Vaping Use  . Vaping Use: Never used  Substance Use Topics  . Alcohol use: No  . Drug use: No     Allergies   Patient has no known allergies.   Review of Systems Review of Systems   Physical Exam Triage Vital Signs ED Triage Vitals  Enc Vitals Group  BP 11/06/19 1310 (S) (!) 157/97     Pulse Rate 11/06/19 1310 82     Resp 11/06/19 1310 16     Temp 11/06/19 1310 98.2 F (36.8 C)     Temp Source 11/06/19 1310 Oral     SpO2 11/06/19 1310 100 %     Weight --      Height --      Head Circumference --      Peak Flow --      Pain Score 11/06/19 1301 5     Pain Loc --      Pain Edu? --      Excl. in GC? --    No data found.  Updated Vital Signs BP (S) (!) 159/98 (BP Location: Right Arm) Comment: repoorts baseline in 130's  Pulse 82   Temp 98.2 F (36.8 C) (Oral)   Resp 16   LMP 10/18/2019   SpO2 100%   Visual Acuity Right Eye Distance:   Left Eye Distance:   Bilateral Distance:    Right Eye Near:   Left Eye Near:    Bilateral Near:     Physical Exam Constitutional:      General: She is not in acute distress.    Appearance: She is well-developed.  Neck:      Comments: Right neck muscular tenderness on palpation  Cardiovascular:     Rate and Rhythm: Normal rate and regular rhythm.     Pulses: Normal pulses.  Pulmonary:     Effort: Pulmonary effort is normal.     Breath sounds: Normal breath sounds.  Chest:     Chest wall: Tenderness present.       Comments: No breast redness, swelling or mass but left chest wall is tender to touch Musculoskeletal:     Left upper arm: Tenderness present. No bony tenderness.     Cervical back: Muscular tenderness present. No spinous process tenderness.     Right lower leg: No edema.     Left lower leg: No edema.     Comments: Generalized "soreness" on palpation to left upper arm on palpation, full ROM but feels sore with engagement of arm  Skin:    General: Skin is warm and dry.  Neurological:     Mental Status: She is alert and oriented to person, place, and time.    EKG:  NSR. Previous EKG was not available for review. No stwave changes as interpreted by me.     UC Treatments / Results  Labs (all labs ordered are listed, but only abnormal results are displayed) Labs Reviewed - No data to display  EKG   Radiology DG Chest 2 View  Result Date: 11/06/2019 CLINICAL DATA:  Chest pain and cough.  Shortness of breath. EXAM: CHEST - 2 VIEW COMPARISON:  None FINDINGS: Lungs are clear. Heart size and pulmonary vascular normal. No adenopathy. No bone lesions. No pneumothorax. IMPRESSION: Lungs clear.  Cardiac silhouette normal. Electronically Signed   By: Bretta Bang III M.D.   On: 11/06/2019 13:25    Procedures Procedures (including critical care time)  Medications Ordered in UC Medications - No data to display  Initial Impression / Assessment and Plan / UC Course  I have reviewed the triage vital signs and the nursing notes.  Pertinent labs & imaging results that were available during my care of the patient were reviewed by me and considered in my medical decision making (see chart for  details).     Exam reassuring,  pain reproducible with palpation to chest, arm, neck. CXR normal. ekg without acute findings. MSK source of symptoms likely. Patient declines covid testing, which was also discussed in differential. Return precautions provided. Patient verbalized understanding and agreeable to plan.   Final Clinical Impressions(s) / UC Diagnoses   Final diagnoses:  Chest wall pain  Arthralgia of left upper arm  Neck pain on right side     Discharge Instructions     Your exam is reassuring today, with the ability to reproduce pain on palpation, normal chest xray ekg, no heart problem risk factors, this is very unlikely to be related to your heart.  Light activity, stretching, heat or ice application, ibuprofen or tylenol as needed for pain.  Don't hesitate to return for any worsening of symptoms.  I would recommend following up with your primary care provider for recheck of your BP.     ED Prescriptions    None     PDMP not reviewed this encounter.   Georgetta Haber, NP 11/06/19 1338

## 2019-11-06 NOTE — Discharge Instructions (Addendum)
Your exam is reassuring today, with the ability to reproduce pain on palpation, normal chest xray ekg, no heart problem risk factors, this is very unlikely to be related to your heart.  Light activity, stretching, heat or ice application, ibuprofen or tylenol as needed for pain.  Don't hesitate to return for any worsening of symptoms.  I would recommend following up with your primary care provider for recheck of your BP.

## 2019-11-06 NOTE — ED Notes (Signed)
Pt refused COVID testing

## 2019-11-06 NOTE — ED Triage Notes (Signed)
Pt reports intermittent sharp chest pain that started last night c/o of left upper arm pain, lower back pain started today, and lower tightness neck area all started today.

## 2020-01-21 NOTE — L&D Delivery Note (Signed)
Delivery Note Called to room as patient desired to initiate pushing. Patient was found to be 9/100/0 with easily reducible cervix. Bed broken down and patient draped. Forebag was ruptured and pushing initiated. Fetal head was delivered over intact perineum. Double nuchal was present - first was loose and was able to be reduced. The second nuchal was tighter and unable to be reduced. Shoulders and body followed without difficulty. Infant was placed on mother's chest, mouth and nose were bulb suctioned and let out a vigorous cry. Delayed cord clamping was performed for 60 seconds. Venous cord blood was collected. Placenta delivered spontaneously. Cervix, vagina, perineum and labia were inspected and a first degree perineal laceration was noted. The first degree laceration was repaired using 3-0 Vicryl in standard fashion. Pitocin started following delivery of infant. 1g TXA IV and Cytotec per rectum were administered as prophylaxis. Uterus fundus firm. Placenta was inspected, found to be intact with a 3 vessel cord. Counts were correct.   At 6:49 AM a viable and healthy female was delivered via Vaginal, Spontaneous (Presentation: LOA).  APGAR: 9, 9; weight: see infant's chart Placenta status: Spontaneous, Intact.  Cord: 3 vessels with the following complications: None.  Cord pH: Not collected  Anesthesia:  None Episiotomy:  N/A Lacerations:  First degree perineal Suture Repair: 3.0 vicryl Est. Blood Loss (mL):  250  Mom to postpartum.  Baby to Couplet care / Skin to Skin.  Steva Ready 10/13/2020, 7:10 AM
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
## 2020-01-25 IMAGING — XA DG FLUORO GUIDE NDL PLC/BX
2 series · 2 of 2 positions shown · non-contrast
Comparison: none

CLINICAL DATA: Rupture of ulnar collateral ligament of right thumb.

[Series 2: ortho standard · 1 of 1 slices shown (1 of 2)]
[im 1/1]
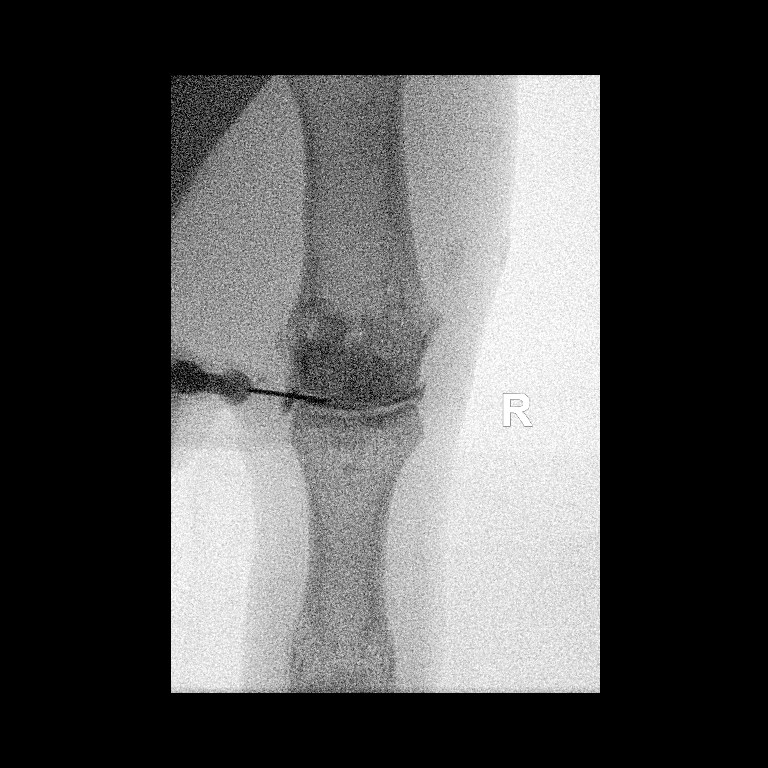

[Series 3: ortho standard · 1 of 1 slices shown (2 of 2)]
[im 1/1]
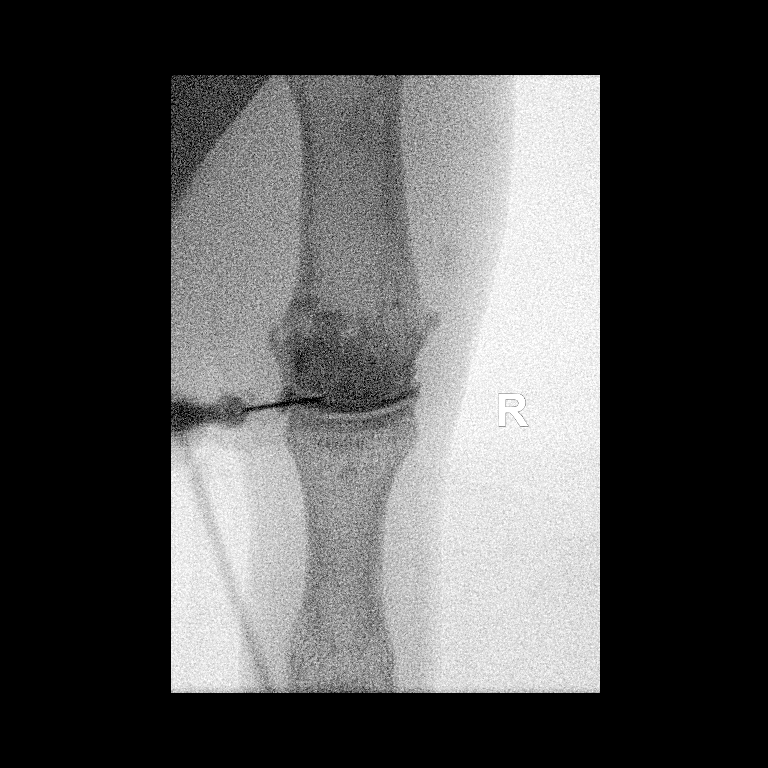

[2 of 2 positions shown; findings below may reference images not displayed]

FLUOROSCOPY TIME:  Radiation Exposure Index (as provided by the
fluoroscopic device): 4.31 microGray*m^2

Fluoroscopy Time (in minutes and seconds):  4 seconds

PROCEDURE:
INJECTION OF THE RIGHT THUMB MCP JOINT

After a thorough discussion of risks and benefits of the procedure,
written and verbal informed consent was obtained for the procedure.
General risks included bleeding, infection, injury to nerves, blood
vessels, and adjacent structures. Specific risks included
extra-articular injection. Allergic reaction was discussed as well.

The right thumb was prepped and draped in the usual sterile fashion
after preliminary fluoroscopic localization over the ulnar aspect of
the MCP joint. Under fluoroscopic guidance, after local anesthesia
with 1% lidocaine without epinephrine, a 25 gauge needle was
advanced into the right thumb MCP joint. Test injection of contrast
showed intra-articular placement. A total volume of 1 mL was infused
into the joint and the capsule was distended. The MR arthrogram
solution was as follows: 0.1 mL of MultiHance, 10 mL of Isovue-M
200, and 10 mL of sterile saline. The needle was were removed and a
sterile dressing applied. No complications. The patient was sent to
MRI.
IMPRESSION: Technically successful right thumb MCP joint injection for MR
arthrogram.

## 2020-04-11 LAB — OB RESULTS CONSOLE HEPATITIS B SURFACE ANTIGEN: Hepatitis B Surface Ag: NEGATIVE

## 2020-04-11 LAB — OB RESULTS CONSOLE HIV ANTIBODY (ROUTINE TESTING): HIV: NONREACTIVE

## 2020-04-11 LAB — OB RESULTS CONSOLE RUBELLA ANTIBODY, IGM: Rubella: IMMUNE

## 2020-07-19 LAB — OB RESULTS CONSOLE RPR: RPR: NONREACTIVE

## 2020-09-19 LAB — OB RESULTS CONSOLE GBS: GBS: NEGATIVE

## 2020-10-13 ENCOUNTER — Other Ambulatory Visit: Payer: Self-pay

## 2020-10-13 ENCOUNTER — Encounter (HOSPITAL_COMMUNITY): Payer: Self-pay | Admitting: Obstetrics and Gynecology

## 2020-10-13 ENCOUNTER — Inpatient Hospital Stay (HOSPITAL_COMMUNITY)
Admission: AD | Admit: 2020-10-13 | Discharge: 2020-10-14 | DRG: 807 | Disposition: A | Discharge status: Home / Self Care | Payer: 59 | Attending: Obstetrics and Gynecology | Admitting: Obstetrics and Gynecology

## 2020-10-13 DIAGNOSIS — Z20822 Contact with and (suspected) exposure to covid-19: Secondary | ICD-10-CM | POA: Diagnosis present

## 2020-10-13 DIAGNOSIS — Z3A40 40 weeks gestation of pregnancy: Secondary | ICD-10-CM

## 2020-10-13 DIAGNOSIS — O26893 Other specified pregnancy related conditions, third trimester: Secondary | ICD-10-CM | POA: Diagnosis present

## 2020-10-13 LAB — CBC
HCT: 34.6 % — ABNORMAL LOW (ref 36.0–46.0)
Hemoglobin: 11 g/dL — ABNORMAL LOW (ref 12.0–15.0)
MCH: 26.8 pg (ref 26.0–34.0)
MCHC: 31.8 g/dL (ref 30.0–36.0)
MCV: 84.4 fL (ref 80.0–100.0)
Platelets: 219 10*3/uL (ref 150–400)
RBC: 4.1 MIL/uL (ref 3.87–5.11)
RDW: 14.3 % (ref 11.5–15.5)
WBC: 10.2 10*3/uL (ref 4.0–10.5)
nRBC: 0 % (ref 0.0–0.2)

## 2020-10-13 LAB — RPR: RPR Ser Ql: NONREACTIVE

## 2020-10-13 LAB — RESP PANEL BY RT-PCR (FLU A&B, COVID) ARPGX2
Influenza A by PCR: NEGATIVE
Influenza B by PCR: NEGATIVE
SARS Coronavirus 2 by RT PCR: NEGATIVE

## 2020-10-13 LAB — TYPE AND SCREEN
ABO/RH(D): O POS
Antibody Screen: NEGATIVE

## 2020-10-13 MED ORDER — ONDANSETRON HCL 4 MG PO TABS: 4.0000 mg | ORAL_TABLET | ORAL | Status: DC | PRN

## 2020-10-13 MED ORDER — MISOPROSTOL 200 MCG PO TABS
1000.0000 ug | ORAL_TABLET | Freq: Once | ORAL | Status: AC
  Administered 2020-10-13: 1000 ug via RECTAL

## 2020-10-13 MED ORDER — TRANEXAMIC ACID-NACL 1000-0.7 MG/100ML-% IV SOLN
INTRAVENOUS | Status: AC
  Filled 2020-10-13: qty 100

## 2020-10-13 MED ORDER — ONDANSETRON HCL 4 MG/2ML IJ SOLN: 4.0000 mg | INTRAMUSCULAR | Status: DC | PRN

## 2020-10-13 MED ORDER — TETANUS-DIPHTH-ACELL PERTUSSIS 5-2.5-18.5 LF-MCG/0.5 IM SUSY: 0.5000 mL | PREFILLED_SYRINGE | Freq: Once | INTRAMUSCULAR | Status: DC

## 2020-10-13 MED ORDER — LIDOCAINE HCL (PF) 1 % IJ SOLN
30.0000 mL | INTRAMUSCULAR | Status: DC | PRN
  Administered 2020-10-13: 30 mL via SUBCUTANEOUS
  Filled 2020-10-13: qty 30

## 2020-10-13 MED ORDER — OXYCODONE-ACETAMINOPHEN 5-325 MG PO TABS: 1.0000 | ORAL_TABLET | ORAL | Status: DC | PRN

## 2020-10-13 MED ORDER — OXYCODONE-ACETAMINOPHEN 5-325 MG PO TABS: 2.0000 | ORAL_TABLET | ORAL | Status: DC | PRN

## 2020-10-13 MED ORDER — LACTATED RINGERS IV SOLN: 500.0000 mL | INTRAVENOUS | Status: DC | PRN

## 2020-10-13 MED ORDER — OXYTOCIN-SODIUM CHLORIDE 30-0.9 UT/500ML-% IV SOLN
2.5000 [IU]/h | INTRAVENOUS | Status: DC
  Filled 2020-10-13: qty 500

## 2020-10-13 MED ORDER — ACETAMINOPHEN 325 MG PO TABS: 650.0000 mg | ORAL_TABLET | ORAL | Status: DC | PRN

## 2020-10-13 MED ORDER — SIMETHICONE 80 MG PO CHEW: 80.0000 mg | CHEWABLE_TABLET | ORAL | Status: DC | PRN

## 2020-10-13 MED ORDER — WITCH HAZEL-GLYCERIN EX PADS: 1.0000 "application " | MEDICATED_PAD | CUTANEOUS | Status: DC | PRN

## 2020-10-13 MED ORDER — SENNOSIDES-DOCUSATE SODIUM 8.6-50 MG PO TABS: 2.0000 | ORAL_TABLET | Freq: Every day | ORAL | Status: DC

## 2020-10-13 MED ORDER — TRANEXAMIC ACID-NACL 1000-0.7 MG/100ML-% IV SOLN: 1000.0000 mg | INTRAVENOUS | Status: DC

## 2020-10-13 MED ORDER — IBUPROFEN 600 MG PO TABS
600.0000 mg | ORAL_TABLET | Freq: Four times a day (QID) | ORAL | Status: DC
  Filled 2020-10-13 (×2): qty 1

## 2020-10-13 MED ORDER — SOD CITRATE-CITRIC ACID 500-334 MG/5ML PO SOLN: 30.0000 mL | ORAL | Status: DC | PRN

## 2020-10-13 MED ORDER — MISOPROSTOL 200 MCG PO TABS
ORAL_TABLET | ORAL | Status: AC
  Filled 2020-10-13: qty 5

## 2020-10-13 MED ORDER — PRENATAL MULTIVITAMIN CH
1.0000 | ORAL_TABLET | Freq: Every day | ORAL | Status: DC
  Filled 2020-10-13: qty 1

## 2020-10-13 MED ORDER — COCONUT OIL OIL: 1.0000 "application " | TOPICAL_OIL | Status: DC | PRN

## 2020-10-13 MED ORDER — BENZOCAINE-MENTHOL 20-0.5 % EX AERO: 1.0000 "application " | INHALATION_SPRAY | CUTANEOUS | Status: DC | PRN

## 2020-10-13 MED ORDER — FLEET ENEMA 7-19 GM/118ML RE ENEM: 1.0000 | ENEMA | RECTAL | Status: DC | PRN

## 2020-10-13 MED ORDER — DIPHENHYDRAMINE HCL 25 MG PO CAPS: 25.0000 mg | ORAL_CAPSULE | Freq: Four times a day (QID) | ORAL | Status: DC | PRN

## 2020-10-13 MED ORDER — LACTATED RINGERS IV SOLN: INTRAVENOUS | Status: DC

## 2020-10-13 MED ORDER — OXYTOCIN BOLUS FROM INFUSION
333.0000 mL | Freq: Once | INTRAVENOUS | Status: AC
  Administered 2020-10-13: 333 mL via INTRAVENOUS

## 2020-10-13 MED ORDER — ONDANSETRON HCL 4 MG/2ML IJ SOLN: 4.0000 mg | Freq: Four times a day (QID) | INTRAMUSCULAR | Status: DC | PRN

## 2020-10-13 MED ORDER — DIBUCAINE (PERIANAL) 1 % EX OINT: 1.0000 "application " | TOPICAL_OINTMENT | CUTANEOUS | Status: DC | PRN

## 2020-10-13 MED ORDER — ZOLPIDEM TARTRATE 5 MG PO TABS: 5.0000 mg | ORAL_TABLET | Freq: Every evening | ORAL | Status: DC | PRN

## 2020-10-13 NOTE — H&P (Signed)
HPI: 40 y.o. [id number] @ [redacted]w[redacted]d (as dated by LMP c/w 13 week ultrasound) presents for contractions. Patient was found to be 6cm in MAU at 0608 and reported ruptured membranes 15 minutes prior to arrival.  Leakage of fluid:  Yes Vaginal bleeding:  No Contractions:  Yes Fetal movement:  Yes  Prenatal care has been provided by Dr. Gerald Leitz [facility])  ROS:  Denies fevers, chills, chest pain, visual changes, SOB, RUQ/epigastric pain, N/V, dysuria, hematuria, or sudden onset/worsening bilateral LE or facial edema.  Pregnancy complicated by: AMA (40) Grandmultiparity Anxiety/depression History of (no medications)  Prenatal Transfer Tool  Maternal Diabetes: No Genetic Screening: Declined Maternal Ultrasounds/Referrals: Normal Fetal Ultrasounds or other Referrals:  None Maternal Substance Abuse:  No Significant Maternal Medications:  None Significant Maternal Lab Results: Group B Strep negative   Prenatal Labs Blood type:  O Positive Antibody screen:  Negative CBC:  H/H 10.5/32.3 Rubella: Immune RPR:  Non-reactive Hep B:  Negative Hep C:  Negative HIV:  Negative GC/CT:  Negative Glucola:  127.2 (wnl)  Immunizations: Tdap: Declined Flu: Has not received  OBHx:  OB History     Gravida  10   Para  7   Term  7   Preterm  0   AB  2   Living  7      SAB  1   IAB  1   Ectopic  0   Multiple  0   Live Births  7          PMHx:  See above Meds:  PNV Allergy:  No Known Allergies SurgHx: Appendectomy, right thumb SocHx:   Denies Tobacco, ETOH, illicit drugs  O: LMP 10/18/2019  Gen. AAOx3, NAD CV.  Normal rate Resp. Breathing through contractions Abd. Gravid, soft, non-tender throughout, no rebound/guarding Extr.  No bilateral LE edema, no calf tenderness bilaterally SVE: 9/100/0, sutures and forebag palpated  Last [place] (08/28/20): EFW 2250g (50%), AAFV, cephalic, right lateral placenta   FHT: 140 baseline, moderate variability, - accels,  -  decels Toco: q1-2 min   Labs: see orders  A/P:  40 y.o. [id number] @ [redacted]w[redacted]d who presents for active labor.  - Admit to L&D - Admit labs (CBC, T&S, COVID screen) - CEFM/Toco - FWB:  Category I - Diet:  Clear liquids - IVF:  LR @ 125cc/hour - VTE Prophylaxis:  SCDs - GBS Status:  Negative - Presentation:  Cephalic by sutures - Pain control:  Per patient request - Anticipate SVD  Patient ready to initiate pushing upon my arrival to L&D. See delivery note.  Steva Ready, DO [phone] (office)

## 2020-10-13 NOTE — MAU Note (Signed)
Water broke 20 min ago, clear fluid. Contractions every 2 min. Reports has precipitous deliveries and reports GBS neg.

## 2020-10-14 LAB — CBC
HCT: 30.4 % — ABNORMAL LOW (ref 36.0–46.0)
Hemoglobin: 9.8 g/dL — ABNORMAL LOW (ref 12.0–15.0)
MCH: 27.1 pg (ref 26.0–34.0)
MCHC: 32.2 g/dL (ref 30.0–36.0)
MCV: 84 fL (ref 80.0–100.0)
Platelets: 187 10*3/uL (ref 150–400)
RBC: 3.62 MIL/uL — ABNORMAL LOW (ref 3.87–5.11)
RDW: 14.1 % (ref 11.5–15.5)
WBC: 13.4 10*3/uL — ABNORMAL HIGH (ref 4.0–10.5)
nRBC: 0 % (ref 0.0–0.2)

## 2020-10-14 MED ORDER — IBUPROFEN 600 MG PO TABS: 600.0000 mg | ORAL_TABLET | Freq: Four times a day (QID) | ORAL | 3 refills | Status: AC | PRN

## 2020-10-14 NOTE — Discharge Summary (Signed)
Postpartum Discharge Summary  Date of Service: 10/14/20      Patient Name: Molly Silva DOB: 03/19/1980 MRN: [id number]  Date of admission: 10/13/2020 Delivery date:10/13/2020  Delivering provider: ,   Date of discharge: 10/14/2020  Admitting diagnosis: Normal labor [O80, Z37.9] Intrauterine pregnancy: [redacted]w[redacted]d     Secondary diagnosis:  Active Problems:   Normal labor  Additional problems: Anxiety/depression, History of hypothyroidism    Discharge diagnosis: Term Pregnancy Delivered                                              Post partum procedures: None Augmentation: N/A Complications: None  Hospital course: Onset of Labor With Vaginal Delivery      40 y.o. yo [id number] at [redacted]w[redacted]d was admitted in Active Labor on 10/13/2020. Patient had an uncomplicated labor course as follows:  Membrane Rupture Time/Date:  ,   Delivery Method:Vaginal, Spontaneous  Episiotomy:   Lacerations:  1st degree  Patient had an uncomplicated postpartum course.  She is ambulating, tolerating a regular diet, passing flatus, and urinating well. Patient is discharged home in stable condition on 10/14/20.  Newborn Data: Birth date:10/13/2020  Birth time:6:49 AM  Gender:Female  Living status:Living  Apgars:9 ,9  Weight:3385 g   Magnesium Sulfate received: No BMZ received: No Rhophylac:N/A MMR:N/A T-DaP: Ordered postpartum  (declined during pregnancy) Flu: Declines Transfusion:No  Physical exam  Vitals:   10/13/20 1432 10/13/20 1840 10/13/20 2305 10/14/20 0502  BP: 114/64 111/82 113/70 118/77  Pulse: 87 82 91 89  Resp: 16 16 18 18  Temp: 98.8 F (37.1 C) 98.9 F (37.2 C) 98.3 F (36.8 C) 98.3 F (36.8 C)  TempSrc: Oral Oral Oral Oral  SpO2:   100% 100%   General: alert, cooperative, and no distress Cardio: RRR Lungs: CTAB, no wheezes/rales/rhonchi Lochia: appropriate Uterine Fundus: firm Incision: N/A DVT Evaluation: No evidence of DVT seen on physical exam. No cords or calf  tenderness. Labs: Lab Results  Component Value Date   WBC 13.4 (H) 10/14/2020   HGB 9.8 (L) 10/14/2020   HCT 30.4 (L) 10/14/2020   MCV 84.0 10/14/2020   PLT 187 10/14/2020   No flowsheet data found. Edinburgh Score: Edinburgh Postnatal Depression Scale Screening Tool 05/09/2019  I have been able to laugh and see the funny side of things. 0  I have looked forward with enjoyment to things. 0  I have blamed myself unnecessarily when things went wrong. 1  I have been anxious or worried for no good reason. 0  I have felt scared or panicky for no good reason. 0  Things have been getting on top of me. 2  I have been so unhappy that I have had difficulty sleeping. 0  I have felt sad or miserable. 0  I have been so unhappy that I have been crying. 0  The thought of harming myself has occurred to me. 0  Edinburgh Postnatal Depression Scale Total 3      After visit meds:  Allergies as of 10/14/2020   No Known Allergies      Medication List     STOP taking these medications    aspirin-sod bicarb-citric acid 325 MG Tbef tablet Commonly known as: ALKA-SELTZER       TAKE these medications    ibuprofen 600 MG tablet Commonly known as: ADVIL Take 1 tablet (600 mg total) by mouth   every 6 (six) hours as needed.   prenatal multivitamin Tabs tablet Take 1 tablet by mouth daily at 12 noon.         Discharge home in stable condition Infant Feeding: Breast Infant Disposition:home with mother Discharge instruction: per After Visit Summary and Postpartum booklet. Activity: Advance as tolerated. Pelvic rest for 6 weeks.  Diet: routine diet Anticipated Birth Control:  None at this time Postpartum Appointment:6 weeks Additional Postpartum F/U: Postpartum Depression checkup: 2 weeks Future Appointments:No future appointments. Follow up Visit:  Follow-up Information     Cole, Tara, MD Follow up in 6 week(s).   Specialty: Obstetrics and Gynecology Why: Our office will arrange  your 6 week postpartum visit. Contact information: [address] [place] [address] [phone]                     10/14/2020  , DO   

## 2020-10-23 ENCOUNTER — Telehealth (HOSPITAL_COMMUNITY): Payer: Self-pay | Admitting: *Deleted

## 2020-10-23 NOTE — Telephone Encounter (Signed)
Attempted hospital discharge follow-up call. Left message for patient to return RN call. Deforest Hoyles, RN, 10/23/20, [phone].

## 2021-03-08 ENCOUNTER — Other Ambulatory Visit (HOSPITAL_COMMUNITY)
Admission: RE | Admit: 2021-03-08 | Discharge: 2021-03-08 | Disposition: A | Discharge status: Home / Self Care | Payer: 59 | Source: Ambulatory Visit | Attending: Obstetrics and Gynecology | Admitting: Obstetrics and Gynecology

## 2021-03-08 ENCOUNTER — Other Ambulatory Visit: Payer: Self-pay | Admitting: Obstetrics and Gynecology

## 2021-03-08 DIAGNOSIS — Z01419 Encounter for gynecological examination (general) (routine) without abnormal findings: Secondary | ICD-10-CM | POA: Diagnosis present

## 2021-03-12 LAB — CYTOLOGY - PAP
Comment: NEGATIVE
Diagnosis: NEGATIVE
High risk HPV: NEGATIVE

## 2023-10-19 ENCOUNTER — Other Ambulatory Visit: Payer: Self-pay | Admitting: Obstetrics and Gynecology

## 2023-10-19 DIAGNOSIS — Z0000 Encounter for general adult medical examination without abnormal findings: Secondary | ICD-10-CM

## 2023-10-27 ENCOUNTER — Ambulatory Visit
Admission: RE | Admit: 2023-10-27 | Discharge: 2023-10-27 | Disposition: A | Discharge status: Home / Self Care | Source: Ambulatory Visit | Attending: Obstetrics and Gynecology | Admitting: Obstetrics and Gynecology

## 2023-10-27 DIAGNOSIS — Z0000 Encounter for general adult medical examination without abnormal findings: Secondary | ICD-10-CM
